# Patient Record
Sex: Female | Born: 1990 | Race: White | Hispanic: No | Marital: Single | Smoking: Former smoker
Health system: Southern US, Community
[De-identification: ages and names within clinical notes are randomized; demographics above are authoritative.]

## PROBLEM LIST (undated history)

## (undated) DIAGNOSIS — Z789 Other specified health status: Secondary | ICD-10-CM

## (undated) HISTORY — PX: NO PAST SURGERIES: SHX2092

## (undated) HISTORY — DX: Other specified health status: Z78.9

---

## 2016-07-23 ENCOUNTER — Encounter: Payer: Self-pay | Admitting: Obstetrics and Gynecology

## 2016-07-24 ENCOUNTER — Ambulatory Visit: Payer: Self-pay | Admitting: Obstetrics and Gynecology

## 2016-07-24 ENCOUNTER — Encounter: Payer: Self-pay | Admitting: Obstetrics and Gynecology

## 2016-07-24 VITALS — BP 102/72 | HR 128 | Ht 65.0 in | Wt 168.0 lb

## 2016-07-24 DIAGNOSIS — N914 Secondary oligomenorrhea: Secondary | ICD-10-CM

## 2016-07-24 DIAGNOSIS — Z30011 Encounter for initial prescription of contraceptive pills: Secondary | ICD-10-CM

## 2016-07-24 NOTE — Progress Notes (Signed)
   Chief Complaint  Patient presents with  . Menstrual Problem    HPI:      Ms. Suzanne Davies is a 26 y.o. No obstetric history on file. who LMP was Patient's last menstrual period was 07/10/2016 (exact date)., presents today for NP menstrual problem. Pt did not have a period 3/18 and 4/18 with neg home UPT. She did have a regular period again 5/18. Menses are usually monthly, lasting 4-5 days, med flow. No BTB. Pos dysmen, not relieved with ibup. She sometimes has to miss activities due to sx. She has never missed her period before and was concerned. She had increased stress with relationship issues 2/18.  She is currently sex active, using condoms. She would like BC for dysmen. [facility] No hx of migraines with aura/HTN/clotting disorders.     There are no active problems to display for this patient.  Past Medical History:  Diagnosis Date  . Known health problems: none    Past Surgical History:  Procedure Laterality Date  . NO PAST SURGERIES       Family History  Problem Relation Age of Onset  . AAA (abdominal aortic aneurysm) Maternal Grandmother   . AAA (abdominal aortic aneurysm) Maternal Grandfather     Social History   Social History  . Marital status: Unknown    Spouse name: N/A  . Number of children: N/A  . Years of education: N/A   Occupational History  . Not on file.   Social History Main Topics  . Smoking status: Current Every Day Smoker    Packs/day: 0.25  . Smokeless tobacco: Never Used  . Alcohol use Yes  . Drug use: No  . Sexual activity: Yes    Birth control/ protection: Condom   Other Topics Concern  . Not on file   Social History Narrative  . No narrative on file    No current outpatient prescriptions on file.  Review of Systems  Constitutional: Negative for fever.  Gastrointestinal: Negative for blood in stool, constipation, diarrhea, nausea and vomiting.  Genitourinary: Positive for menstrual problem. Negative for  dyspareunia, dysuria, flank pain, frequency, hematuria, urgency, vaginal bleeding, vaginal discharge and vaginal pain.  Musculoskeletal: Negative for back pain.  Skin: Negative for rash.  Neurological: Positive for numbness. Negative for light-headedness and headaches.     OBJECTIVE:   Vitals:  BP 102/72   Pulse (!) 128   Ht 5\' 5"  (1.651 m)   Wt 168 lb (76.2 kg)   LMP 07/10/2016 (Exact Date)   BMI 27.96 kg/m   Physical Exam  DEFERRED  Assessment/Plan: Encounter for initial prescription of contraceptive pills - OCP start with next menses. Condoms. RTO in 6 wks for f/u /annual.  Secondary oligomenorrhea - Resolved spontaneously. Don't know etiology. F/u prn.     Return in about 6 weeks (around 09/04/2016) for annual.  Yadriel Kerrigan B. Tiasha Helvie, PA-C 07/24/2016 5:13 PM

## 2016-09-16 ENCOUNTER — Ambulatory Visit: Payer: Managed Care, Other (non HMO) | Admitting: Obstetrics and Gynecology

## 2018-11-01 ENCOUNTER — Emergency Department
Admission: EM | Admit: 2018-11-01 | Discharge: 2018-11-01 | Disposition: A | Discharge status: Home / Self Care | Payer: Self-pay | Attending: Student in an Organized Health Care Education/Training Program | Admitting: Student in an Organized Health Care Education/Training Program

## 2018-11-01 ENCOUNTER — Other Ambulatory Visit: Payer: Self-pay

## 2018-11-01 ENCOUNTER — Emergency Department: Payer: Self-pay

## 2018-11-01 DIAGNOSIS — F1510 Other stimulant abuse, uncomplicated: Secondary | ICD-10-CM | POA: Insufficient documentation

## 2018-11-01 DIAGNOSIS — Z20828 Contact with and (suspected) exposure to other viral communicable diseases: Secondary | ICD-10-CM | POA: Insufficient documentation

## 2018-11-01 DIAGNOSIS — F17210 Nicotine dependence, cigarettes, uncomplicated: Secondary | ICD-10-CM | POA: Insufficient documentation

## 2018-11-01 DIAGNOSIS — F1210 Cannabis abuse, uncomplicated: Secondary | ICD-10-CM | POA: Insufficient documentation

## 2018-11-01 DIAGNOSIS — F1310 Sedative, hypnotic or anxiolytic abuse, uncomplicated: Secondary | ICD-10-CM | POA: Insufficient documentation

## 2018-11-01 DIAGNOSIS — R462 Strange and inexplicable behavior: Secondary | ICD-10-CM

## 2018-11-01 DIAGNOSIS — F1410 Cocaine abuse, uncomplicated: Secondary | ICD-10-CM | POA: Insufficient documentation

## 2018-11-01 DIAGNOSIS — F1910 Other psychoactive substance abuse, uncomplicated: Secondary | ICD-10-CM | POA: Diagnosis present

## 2018-11-01 LAB — URINALYSIS, COMPLETE (UACMP) WITH MICROSCOPIC
Bacteria, UA: NONE SEEN
Bilirubin Urine: NEGATIVE
Glucose, UA: NEGATIVE mg/dL
Hgb urine dipstick: NEGATIVE
Ketones, ur: NEGATIVE mg/dL
Leukocytes,Ua: NEGATIVE
Nitrite: NEGATIVE
Protein, ur: NEGATIVE mg/dL
Specific Gravity, Urine: 1.006 (ref 1.005–1.030)
WBC, UA: NONE SEEN WBC/hpf (ref 0–5)
pH: 6 (ref 5.0–8.0)

## 2018-11-01 LAB — URINE DRUG SCREEN, QUALITATIVE (ARMC ONLY)
Amphetamines, Ur Screen: POSITIVE — AB
Barbiturates, Ur Screen: NOT DETECTED
Benzodiazepine, Ur Scrn: POSITIVE — AB
Cannabinoid 50 Ng, Ur ~~LOC~~: POSITIVE — AB
Cocaine Metabolite,Ur ~~LOC~~: POSITIVE — AB
MDMA (Ecstasy)Ur Screen: NOT DETECTED
Methadone Scn, Ur: NOT DETECTED
Opiate, Ur Screen: NOT DETECTED
Phencyclidine (PCP) Ur S: NOT DETECTED
Tricyclic, Ur Screen: POSITIVE — AB

## 2018-11-01 LAB — CBC WITH DIFFERENTIAL/PLATELET
Abs Immature Granulocytes: 0.03 10*3/uL (ref 0.00–0.07)
Basophils Absolute: 0 10*3/uL (ref 0.0–0.1)
Basophils Relative: 1 %
Eosinophils Absolute: 0.1 10*3/uL (ref 0.0–0.5)
Eosinophils Relative: 1 %
HCT: 41.6 % (ref 36.0–46.0)
Hemoglobin: 13.9 g/dL (ref 12.0–15.0)
Immature Granulocytes: 0 %
Lymphocytes Relative: 29 %
Lymphs Abs: 2.1 10*3/uL (ref 0.7–4.0)
MCH: 32 pg (ref 26.0–34.0)
MCHC: 33.4 g/dL (ref 30.0–36.0)
MCV: 95.9 fL (ref 80.0–100.0)
Monocytes Absolute: 0.3 10*3/uL (ref 0.1–1.0)
Monocytes Relative: 5 %
Neutro Abs: 4.5 10*3/uL (ref 1.7–7.7)
Neutrophils Relative %: 64 %
Platelets: 198 10*3/uL (ref 150–400)
RBC: 4.34 MIL/uL (ref 3.87–5.11)
RDW: 13.1 % (ref 11.5–15.5)
WBC: 7 10*3/uL (ref 4.0–10.5)
nRBC: 0 % (ref 0.0–0.2)

## 2018-11-01 LAB — ACETAMINOPHEN LEVEL: Acetaminophen (Tylenol), Serum: <10 ug/mL — ABNORMAL LOW (ref 10–30)

## 2018-11-01 LAB — POCT PREGNANCY, URINE
Preg Test, Ur: NEGATIVE
Preg Test, Ur: NEGATIVE

## 2018-11-01 LAB — COMPREHENSIVE METABOLIC PANEL
ALT: 19 U/L (ref 0–44)
AST: 27 U/L (ref 15–41)
Albumin: 4.2 g/dL (ref 3.5–5.0)
Alkaline Phosphatase: 52 U/L (ref 38–126)
Anion gap: 10 (ref 5–15)
BUN: 9 mg/dL (ref 6–20)
CO2: 23 mmol/L (ref 22–32)
Calcium: 8.6 mg/dL — ABNORMAL LOW (ref 8.9–10.3)
Chloride: 108 mmol/L (ref 98–111)
Creatinine, Ser: 0.72 mg/dL (ref 0.44–1.00)
GFR calc Af Amer: >60 mL/min (ref >60)
GFR calc non Af Amer: >60 mL/min (ref >60)
Glucose, Bld: 90 mg/dL (ref 70–99)
Potassium: 4 mmol/L (ref 3.5–5.1)
Sodium: 141 mmol/L (ref 135–145)
Total Bilirubin: 0.3 mg/dL (ref 0.3–1.2)
Total Protein: 6.9 g/dL (ref 6.5–8.1)

## 2018-11-01 LAB — SARS CORONAVIRUS 2 BY RT PCR (HOSPITAL ORDER, PERFORMED IN ~~LOC~~ HOSPITAL LAB): SARS Coronavirus 2: NEGATIVE

## 2018-11-01 MED ORDER — NALOXONE HCL 2 MG/2ML IJ SOSY
0.4000 mg | PREFILLED_SYRINGE | INTRAMUSCULAR | Status: DC | PRN
  Administered 2018-11-01: 11:00:00 0.4 mg via INTRAVENOUS
  Filled 2018-11-01: qty 2

## 2018-11-01 MED ORDER — SODIUM CHLORIDE 0.9 % IV BOLUS
1000.0000 mL | Freq: Once | INTRAVENOUS | Status: AC
  Administered 2018-11-01: 10:00:00 1000 mL via INTRAVENOUS

## 2018-11-01 MED ORDER — LORAZEPAM 0.5 MG PO TABS
0.5000 mg | ORAL_TABLET | Freq: Once | ORAL | Status: DC
  Filled 2018-11-01: qty 1

## 2018-11-01 NOTE — ED Notes (Signed)
Fathers contact info Tarryn Bogdan [phone]

## 2018-11-01 NOTE — ED Provider Notes (Signed)
[facility] Emergency Department Provider Note    None    (approximate)  I have reviewed the triage vital signs and the nursing notes.   HISTORY  Chief Complaint Altered Mental Status (Unresponsive)    HPI Suzanne Davies is a 28 y.o. female no significant past medical history listed reported presents for being found unresponsive [facility] parking lot this morning.  Patient was not given any Narcan with her was suspected overdose.  Reportedly had multiple cuts straws found in the car.  Patient very anxious appearing somewhat evasive.  Denies any overdose.  States that she has been feeling overwhelmed and has not been sleeping or eating.  She denies any medication use.  Denies any chest pain or pressure.  No shortness of breath.  No headache.    Past Medical History:  Diagnosis Date  . Known health problems: none    Family History  Problem Relation Age of Onset  . AAA (abdominal aortic aneurysm) Maternal Grandmother   . AAA (abdominal aortic aneurysm) Maternal Grandfather    Past Surgical History:  Procedure Laterality Date  . NO PAST SURGERIES     There are no active problems to display for this patient.     Prior to Admission medications   Not on File    Allergies Penicillins and Sulfa antibiotics    Social History Social History   Tobacco Use  . Smoking status: Current Every Day Smoker    Packs/day: 0.25  . Smokeless tobacco: Never Used  Substance Use Topics  . Alcohol use: Yes    Alcohol/week: 1.0 standard drinks    Types: 1 Glasses of wine per week    Comment: 8  . Drug use: No    Review of Systems Patient denies headaches, rhinorrhea, blurry vision, numbness, shortness of breath, chest pain, edema, cough, abdominal pain, nausea, vomiting, diarrhea, dysuria, fevers, rashes or hallucinations unless otherwise stated above in HPI. ____________________________________________   PHYSICAL EXAM:  VITAL SIGNS: Vitals:   11/01/18  1130 11/01/18 1200  BP: 96/67 97/69  Pulse: 93 97  Resp: (!) 25 (!) 25  SpO2: 96% 97%    Constitutional: Alert, anxious appearing, tearful Eyes: Conjunctivae are normal.  Head: Atraumatic. Nose: No congestion/rhinnorhea. Mouth/Throat: Mucous membranes are moist.   Neck: No stridor. Painless ROM.  Cardiovascular: Normal rate, regular rhythm. Grossly normal heart sounds.  Good peripheral circulation. Respiratory: Normal respiratory effort.  No retractions. Lungs CTAB. Gastrointestinal: Soft and nontender. No distention. No abdominal bruits. No CVA tenderness. Genitourinary:  Musculoskeletal: No lower extremity tenderness nor edema.  No joint effusions. Neurologic:  Normal speech and language. No gross focal neurologic deficits are appreciated. No facial droop Skin:  Skin is warm, dry and intact. No rash noted. Psychiatric: Mood and affect are anxious,  Avoids answering questions about history, disorganized_______________________________   LABS (all labs ordered are listed, but only abnormal results are displayed)  Results for orders placed or performed during the hospital encounter of 11/01/18 (from the past 24 hour(s))  CBC with Differential/Platelet     Status: None   Collection Time: 11/01/18  9:39 AM  Result Value Ref Range   WBC 7.0 4.0 - 10.5 K/uL   RBC 4.34 3.87 - 5.11 MIL/uL   Hemoglobin 13.9 12.0 - 15.0 g/dL   HCT 41.6 36.0 - 46.0 %   MCV 95.9 80.0 - 100.0 fL   MCH 32.0 26.0 - 34.0 pg   MCHC 33.4 30.0 - 36.0 g/dL   RDW 13.1 11.5 -  15.5 %   Platelets 198 150 - 400 K/uL   nRBC 0.0 0.0 - 0.2 %   Neutrophils Relative % 64 %   Neutro Abs 4.5 1.7 - 7.7 K/uL   Lymphocytes Relative 29 %   Lymphs Abs 2.1 0.7 - 4.0 K/uL   Monocytes Relative 5 %   Monocytes Absolute 0.3 0.1 - 1.0 K/uL   Eosinophils Relative 1 %   Eosinophils Absolute 0.1 0.0 - 0.5 K/uL   Basophils Relative 1 %   Basophils Absolute 0.0 0.0 - 0.1 K/uL   Immature Granulocytes 0 %   Abs Immature Granulocytes  0.03 0.00 - 0.07 K/uL  Comprehensive metabolic panel     Status: Abnormal   Collection Time: 11/01/18  9:39 AM  Result Value Ref Range   Sodium 141 135 - 145 mmol/L   Potassium 4.0 3.5 - 5.1 mmol/L   Chloride 108 98 - 111 mmol/L   CO2 23 22 - 32 mmol/L   Glucose, Bld 90 70 - 99 mg/dL   BUN 9 6 - 20 mg/dL   Creatinine, Ser 1.82 0.44 - 1.00 mg/dL   Calcium 8.6 (L) 8.9 - 10.3 mg/dL   Total Protein 6.9 6.5 - 8.1 g/dL   Albumin 4.2 3.5 - 5.0 g/dL   AST 27 15 - 41 U/L   ALT 19 0 - 44 U/L   Alkaline Phosphatase 52 38 - 126 U/L   Total Bilirubin 0.3 0.3 - 1.2 mg/dL   GFR calc non Af Amer >60 >60 mL/min   GFR calc Af Amer >60 >60 mL/min   Anion gap 10 5 - 15  Pregnancy, urine POC     Status: None   Collection Time: 11/01/18 10:21 AM  Result Value Ref Range   Preg Test, Ur NEGATIVE NEGATIVE  Urinalysis, Complete w Microscopic     Status: Abnormal   Collection Time: 11/01/18 10:48 AM  Result Value Ref Range   Color, Urine STRAW (A) YELLOW   APPearance HAZY (A) CLEAR   Specific Gravity, Urine 1.006 1.005 - 1.030   pH 6.0 5.0 - 8.0   Glucose, UA NEGATIVE NEGATIVE mg/dL   Hgb urine dipstick NEGATIVE NEGATIVE   Bilirubin Urine NEGATIVE NEGATIVE   Ketones, ur NEGATIVE NEGATIVE mg/dL   Protein, ur NEGATIVE NEGATIVE mg/dL   Nitrite NEGATIVE NEGATIVE   Leukocytes,Ua NEGATIVE NEGATIVE   WBC, UA NONE SEEN 0 - 5 WBC/hpf   Bacteria, UA NONE SEEN NONE SEEN   Squamous Epithelial / LPF 0-5 0 - 5  Urine Drug Screen, Qualitative ([facility] only)     Status: Abnormal   Collection Time: 11/01/18 10:48 AM  Result Value Ref Range   Tricyclic, Ur Screen POSITIVE (A) NONE DETECTED   Amphetamines, Ur Screen POSITIVE (A) NONE DETECTED   MDMA (Ecstasy)Ur Screen NONE DETECTED NONE DETECTED   Cocaine Metabolite,Ur [place] POSITIVE (A) NONE DETECTED   Opiate, Ur Screen NONE DETECTED NONE DETECTED   Phencyclidine (PCP) Ur S NONE DETECTED NONE DETECTED   Cannabinoid 50 Ng, Ur  POSITIVE (A) NONE DETECTED    Barbiturates, Ur Screen NONE DETECTED NONE DETECTED   Benzodiazepine, Ur Scrn POSITIVE (A) NONE DETECTED   Methadone Scn, Ur NONE DETECTED NONE DETECTED  Pregnancy, urine POC     Status: None   Collection Time: 11/01/18 10:50 AM  Result Value Ref Range   Preg Test, Ur NEGATIVE NEGATIVE   ____________________________________________  EKG My review and personal interpretation at Time: 9:33   Indication: ams  Rate: 90  Rhythm: sinus Axis: normal Other: normal intervals, no stemi ____________________________________________  RADIOLOGY  I personally reviewed all radiographic images ordered to evaluate for the above acute complaints and reviewed radiology reports and findings.  These findings were personally discussed with the patient.  Please see medical record for radiology report.  ____________________________________________   PROCEDURES  Procedure(s) performed:  Procedures    Critical Care performed: no ____________________________________________   INITIAL IMPRESSION / ASSESSMENT AND PLAN / ED COURSE  Pertinent labs & imaging results that were available during my care of the patient were reviewed by me and considered in my medical decision making (see chart for details).   DDX: Psychosis, delirium, medication effect, noncompliance, polysubstance abuse, Si, Hi, depression   Suzanne Davies is a 28 y.o. who presents to the ED with symptoms as described above.  Patient is confused bizarre behavior.  Suspect some substance abuse.  Currently cooperative protecting her airway.  Blood will be sent for the by differential.  She denies any substance abuse or overdose.  Clinical Course as of Nov 01 1230  Wynelle LinkSun Nov 01, 2018  1223 Patient with multiple substance in her system likely explaining her abnormal behavior.  Will place under IVC due to concern for intentional overdose.   [PR]  1229 Patient's father at bedside states that she has been going through relationship struggles has not  been sleeping for several days.  He endorses history of marijuana use.  States that she has been alluding to wanting to harm herself.  He thought previously was due to get attention.   [PR]    Clinical Course User Index [PR] Willy Eddyobinson, Bronte Kropf, MD    The patient was evaluated in Emergency Department today for the symptoms described in the history of present illness. He/she was evaluated in the context of the global COVID-19 pandemic, which necessitated consideration that the patient might be at risk for infection with the SARS-CoV-2 virus that causes COVID-19. Institutional protocols and algorithms that pertain to the evaluation of patients at risk for COVID-19 are in a state of rapid change based on information released by regulatory bodies including the CDC and federal and state organizations. These policies and algorithms were followed during the patient's care in the ED.  As part of my medical decision making, I reviewed the following data within the electronic MEDICAL RECORD NUMBER Nursing notes reviewed and incorporated, Labs reviewed, notes from prior ED visits and [place] Controlled Substance Database   ____________________________________________   FINAL CLINICAL IMPRESSION(S) / ED DIAGNOSES  Final diagnoses:  Polysubstance abuse (HCC)  Bizarre behavior      NEW MEDICATIONS STARTED DURING THIS VISIT:  New Prescriptions   No medications on file     Note:  This document was prepared using Dragon voice recognition software and may include unintentional dictation errors.    Willy Eddyobinson, Kenyetta Fife, MD 11/01/18 [phone]

## 2018-11-01 NOTE — ED Triage Notes (Signed)
Cuts on bottom of feet, pupils unequal.

## 2018-11-01 NOTE — BH Assessment (Signed)
Assessment Note  Suzanne Davies is an 28 y.o. female who presents to the ER after she was found unresponsive in the parking lot of a local pharmacy store. Per the report of the patient, she does not know why she was brought to the ER. She denies having a suicide attempt, as well as overdose. Patient states, "I was sleep."  Per the report of the patient's father Susy Frizzle Bazzle-[phone]), the patient doesn't have a history of self-injurious behaviors nor suicidal ideations and gestures. He also reports she works long hours and "was probably tired." She has history of "staying up for long hours to do work and crashed." Patient's father reports of having no knowledge of the patient using substances. Writer didn't disclose anything about her substance use.  Per the report of the patient's boyfriend Tasia Catchings H.), the patient was at his home and they were drinking. He stopped drinking before she stopped, so he can drop her off. He dropped her off at her car at the local grocery store and that was the last time he saw her. He further reports, the patient has no history of self-harm or suicidal gestures or ideations. He admits to the use of other substances but wouldn't give details.  During the interview, the patient was lethargic but was able to participate with the assessment. Several times throughout the interview, she denies SI/HI and AV/H. She admits to the use of mind-altering substances.   Diagnosis: Substance Use Disorder  Past Medical History:  Past Medical History:  Diagnosis Date  . Known health problems: none     Past Surgical History:  Procedure Laterality Date  . NO PAST SURGERIES      Family History:  Family History  Problem Relation Age of Onset  . AAA (abdominal aortic aneurysm) Maternal Grandmother   . AAA (abdominal aortic aneurysm) Maternal Grandfather     Social History:  reports that she has been smoking. She has been smoking about 0.25 packs per day. She has never used smokeless  tobacco. She reports current alcohol use of about 1.0 standard drinks of alcohol per week. She reports that she does not use drugs.  Additional Social History:  Alcohol / Drug Use Pain Medications: See PTA Prescriptions: See PTA Over the Counter: See PTA History of alcohol / drug use?: Yes Longest period of sobriety (when/how long): Unable to quantify Substance #1 Name of Substance 1: Cananbis 1 - Last Use / Amount: 10/31/2018 Substance #2 Name of Substance 2: Alcohol 2 - Last Use / Amount: 10/31/2018  CIWA: CIWA-Ar BP: 110/84 Pulse Rate: 79 COWS:    Allergies:  Allergies  Allergen Reactions  . Penicillins Anaphylaxis  . Sulfa Antibiotics Anaphylaxis    Home Medications: (Not in a hospital admission)   OB/GYN Status:  Patient's last menstrual period was 10/20/2018 (approximate).  General Assessment Data Location of Assessment: [facility] ED TTS Assessment: In system Is this a Tele or Face-to-Face Assessment?: Face-to-Face Is this an Initial Assessment or a Re-assessment for this encounter?: Initial Assessment Language Other than English: No Living Arrangements: Other (Comment)(Private Home) What gender do you identify as?: Female Marital status: Long term relationship Maiden name: n/a Pregnancy Status: No Living Arrangements: Alone Can pt return to current living arrangement?: Yes Admission Status: Involuntary Petitioner: ED Attending Is patient capable of signing voluntary admission?: No(Under IVC) Referral Source: Self/Family/Friend Insurance type: None  Medical Screening Exam [facility] Walk-in ONLY) Medical Exam completed: Yes  Crisis Care Plan Living Arrangements: Alone Legal Guardian: Other:(Self) Name of Psychiatrist: Reports  of none Name of Therapist: Reports of none  Education Status Is patient currently in school?: No Is the patient employed, unemployed or receiving disability?: Employed  Risk to self with the past 6 months Suicidal Ideation: No Has  patient been a risk to self within the past 6 months prior to admission? : No Suicidal Intent: No Has patient had any suicidal intent within the past 6 months prior to admission? : No Is patient at risk for suicide?: No Suicidal Plan?: No Has patient had any suicidal plan within the past 6 months prior to admission? : No Access to Means: No What has been your use of drugs/alcohol within the last 12 months?: Alcohol & Cannabis Previous Attempts/Gestures: No Other Self Harm Risks: Reports of none Triggers for Past Attempts: None known Intentional Self Injurious Behavior: None Family Suicide History: No Recent stressful life event(s): Other (Comment)(Increased hours of work) Persecutory voices/beliefs?: No Depression: No Depression Symptoms: (Reports of none) Substance abuse history and/or treatment for substance abuse?: No Suicide prevention information given to non-admitted patients: Not applicable  Risk to Others within the past 6 months Homicidal Ideation: No Does patient have any lifetime risk of violence toward others beyond the six months prior to admission? : No Thoughts of Harm to Others: No Current Homicidal Intent: No Current Homicidal Plan: No Access to Homicidal Means: No Identified Victim: Reports of none History of harm to others?: No Assessment of Violence: None Noted Violent Behavior Description: Reports of none Does patient have access to weapons?: No Criminal Charges Pending?: No Does patient have a court date: No Is patient on probation?: No  Psychosis Hallucinations: None noted Delusions: None noted  Mental Status Report Appearance/Hygiene: Unremarkable, In scrubs Eye Contact: Fair Motor Activity: Unable to assess(Patient was laying in the bed) Speech: Soft, Slurred Level of Consciousness: Drowsy Mood: Pleasant Affect: Appropriate to circumstance Anxiety Level: None Thought Processes: Coherent, Relevant Judgement: Partial Orientation: Person,  Place, Time, Situation, Appropriate for developmental age Obsessive Compulsive Thoughts/Behaviors: None  Cognitive Functioning Concentration: Normal Memory: Recent Intact, Remote Intact Is patient IDD: No Insight: Fair Impulse Control: Fair Appetite: Good Have you had any weight changes? : No Change Sleep: Decreased(Per the report of the patient's father) Total Hours of Sleep: (Unknown) Vegetative Symptoms: None  ADLScreening [facility] Assessment Services) Patient's cognitive ability adequate to safely complete daily activities?: Yes Patient able to express need for assistance with ADLs?: Yes Independently performs ADLs?: Yes (appropriate for developmental age)  Prior Inpatient Therapy Prior Inpatient Therapy: No  Prior Outpatient Therapy Prior Outpatient Therapy: No Does patient have an ACCT team?: No Does patient have Intensive In-House Services?  : No Does patient have [facility] services? : No Does patient have P4CC services?: No  ADL Screening (condition at time of admission) Patient's cognitive ability adequate to safely complete daily activities?: Yes Is the patient deaf or have difficulty hearing?: No Does the patient have difficulty seeing, even when wearing glasses/contacts?: No Does the patient have difficulty concentrating, remembering, or making decisions?: No Patient able to express need for assistance with ADLs?: Yes Does the patient have difficulty dressing or bathing?: No Independently performs ADLs?: Yes (appropriate for developmental age) Does the patient have difficulty walking or climbing stairs?: No Weakness of Legs: None Weakness of Arms/Hands: None  Home Assistive Devices/Equipment Home Assistive Devices/Equipment: None  Therapy Consults (therapy consults require a physician order) PT Evaluation Needed: No OT Evalulation Needed: No SLP Evaluation Needed: No Abuse/Neglect Assessment (Assessment to be complete while patient is alone) Abuse/Neglect  Assessment Can Be Completed: Yes Physical Abuse: Denies Verbal Abuse: Denies Sexual Abuse: Denies Exploitation of patient/patient's resources: Denies Self-Neglect: Denies Values / Beliefs Cultural Requests During Hospitalization: None Spiritual Requests During Hospitalization: None Consults Spiritual Care Consult Needed: No Social Work Consult Needed: No Merchant navy officerAdvance Directives (For Healthcare) Does Patient Have a Medical Advance Directive?: No Would patient like information on creating a medical advance directive?: No - Patient declined       Child/Adolescent Assessment Running Away Risk: Denies(Patient is adult)  Disposition:  Disposition Initial Assessment Completed for this Encounter: Yes  On Site Evaluation by:   Reviewed with Physician:    Lilyan Gilfordalvin J. Addysin Porco MS, LCAS, [facility], NCC Therapeutic Triage Specialist 11/01/2018 7:53 PM

## 2018-11-01 NOTE — ED Notes (Signed)
Pt AOx4. Ambulatory. Steady. Pt belongings returned. Pt signed paper copy of d/c. Verbalizes understanding of d/c paperwork.

## 2018-11-01 NOTE — Discharge Instructions (Signed)
°[facility] (Mental Health & Substance Use Services) & [facility] ° °Mental health service in [place], [place] °Address: [address], [place], [place] [address] °Hours:  °Closed ? Opens 8AM Mon °Phone: [phone] °Substance Use Disorder °Substance use disorder occurs when a person's repeated use of drugs or alcohol interferes with his or her ability to be productive. This disorder can cause problems with mental and physical health. It can affect your ability to have healthy relationships, and it can keep you from being able to meet your responsibilities at work, home, or school. It can also lead to addiction, which is a condition in which the person cannot stop using the substance consistently for a period of time. °Addiction changes the way the brain works. Because of these changes, addiction is a chronic condition. Substance use disorder can be mild, moderate, or severe. °The most commonly abused substances include: °· Alcohol. °· Tobacco. °· Marijuana. °· Stimulants, such as cocaine and methamphetamine. °· Hallucinogens, such as LSD and PCP. °· Opioids, such as some prescription pain medicines and heroin. °What are the causes? °This condition may develop due to many complex social, psychological, or physical reasons, such as: °· Stress. °· Abuse. °· Peer pressure. °· Anxiety or depression. °What increases the risk? °This condition is more likely to develop in people who: °· Use substances to cope with stress. °· Have been abused. °· Have a mental health disorder, such as depression. °· Have a family history of substance use disorder. °What are the signs or symptoms? °Symptoms of this condition include: °· Using the substance for longer periods of time or at a higher dosage than what is normal or intended. °· Having a lasting desire to use the substance. °· Being unable to slow down or stop the use of the  substance. °· Spending an abnormal amount of time getting the substance, using the substance, or recovering from using the substance. °· Using the substance in a way that interferes with work, school, social activities, and personal relationships. °· Using the substance even after having negative consequences, such as: °? Health problems. °? Legal or financial troubles. °? Job loss. °? Relationship problems. °· Needing more and more of the substance to get the same effect (developing tolerance). °· Experiencing unpleasant symptoms if you do not use the substance (withdrawal). °· Using the substance to avoid withdrawal symptoms. °How is this diagnosed? °This condition may be diagnosed based on: °· A physical exam. °· Your history of substance use. °· Your symptoms. This includes: °? How substance use affects your life. °? Changes in personality, behaviors, and mood. °? Having at least two symptoms of substance use disorder within a 12-month period. °? Health issues related to substance use, such as liver damage, shortness of breath, fatigue, cough, or heart problems. °· Blood or urine tests to screen for alcohol and drugs. °How is this treated? °This condition may be treated by: °· Stopping substance use safely. This may require taking medicines and being closely monitored for several days. °· Taking part in group and individual counseling from mental health providers who help people with substance use disorder. °· Staying at a live-in (residential) treatment center for several days or weeks. °· Attending daily counseling sessions at a treatment center. °· Taking medicine as told by your health care provider: °? To ease symptoms and prevent complications during withdrawal. °? To treat other mental health issues, such as depression or anxiety. °?   To block cravings by causing the same effects as the substance. °? To block the effects of the substance or replace good sensations with unpleasant ones. °· Participating in  a support group to share your experience with others who are going through the same thing. These groups are an important part of long-term recovery for many people. °Recovery can be a long process. Many people who undergo treatment start using the substance again after stopping (relapse). If you relapse, that does not mean that treatment will not work. °Follow these instructions at home: ° °· Take over-the-counter and prescription medicines only as told by your health care provider. °· Do not use any drugs or alcohol. °· Avoid temptations or triggers that you associate with your use of the substance. °· Learn and practice techniques for managing stress. °· Have a plan for vulnerable moments. Get phone numbers of people who are willing to help and who are committed to your recovery. °· Attend support groups on a regular basis. These groups include 12-step programs like Alcoholics Anonymous and Narcotics Anonymous. °· Keep all follow-up visits as told by your health care providers. This is important. This includes continuing to work with therapists and support groups. °Contact a health care provider if: °· You cannot take your medicines as told. °· Your symptoms get worse. °· You have trouble resisting the urge to use drugs or alcohol. °Get help right away if you: °· Relapse. °· Think that you may have taken too much of a drug. The hotline of the National Poison Control Center is (800) 222-1222. °· Have signs of an overdose. Symptoms include: °? Chest pain. °? Confusion. °? Sleepiness or difficulty staying awake. °? Slowed breathing. °? Nausea or vomiting. °? A seizure. °· Have serious thoughts about hurting yourself or someone else. °Drug overdose is an emergency. Do not wait to see if the symptoms will go away. Get medical help right away. Call your local emergency services (911 in the [place] Do not drive yourself to the hospital. °If you ever feel like you may hurt yourself or others, or have thoughts about taking  your own life, get help right away. You can go to your nearest emergency department or call: °· Your local emergency services (911 in the [place] °· A suicide crisis helpline, such as the National Suicide Prevention Lifeline at 1-800-273-8255. This is open 24 hours a day. °Summary °· Substance use disorder occurs when a person's repeated use of drugs or alcohol interferes with his or her ability to be productive. °· Taking part in group and individual counseling from mental health providers is a common treatment for people with substance use disorder. °· Recovery can be a long process. Many people who undergo treatment start using the substance again after stopping (relapse). A relapse does not mean that treatment will not work. °· Attend support groups such as Alcoholics Anonymous and Narcotics Anonymous. These groups are an important part of long-term recovery for many people. °This information is not intended to replace advice given to you by your health care provider. Make sure you discuss any questions you have with your health care provider. °Document Released: 09/26/2004 Document Revised: 05/28/2018 Document Reviewed: 03/18/2017 °Elsevier Patient Education © 2020 [employer] Inc. ° °

## 2018-11-01 NOTE — ED Notes (Signed)
Pt given meal tray.

## 2018-11-01 NOTE — ED Triage Notes (Signed)
29 yo female, [facility], unresponsive in car, knowck on window, on response, break window, wakes up, starts to drive off, pulled out of car, confused, not sure where she's at initially, doesn't know why she was at [facility] All VS good  102/58 BS 109 HR 105 98% room air bottle of pills in car found don't belong to her  Colchicine 0.6 mg tablets Straws cut, parephernalia now alert, knows where she was denies drug use, alcohol use, medical problems

## 2018-11-01 NOTE — ED Notes (Signed)
Pt awake at this time and asking this RN about what is going on. This RN explained that she has been IVCd and it waiting on psych to come see her. Pt agreeable at this time.

## 2018-11-01 NOTE — ED Notes (Signed)
Pt denies taking the medications/drugs to hurt herself. Roselyn Reef, NP at bedside.

## 2018-11-01 NOTE — Consult Note (Addendum)
[facility]   Reason for Consult:  overdoae  Referring Physician:  EDP Patient Identification: Suzanne Davies MRN:  [id number] Principal Diagnosis: Polysubstance abuse Diagnosis:  Active Problems:   Polysubstance abuse (HCC)   Total Time spent with patient: 1 hour  Subjective:   Suzanne Davies is a 28 y.o. female patient came to the ED after being drowsy and initially unresponsive in her car.  Reports she was using drugs to stay awake.  UDS positive for amphetamines, cocaine, benzodiazepines, and cannabis.  Patient seen and evaluated in person by this provider.  She was lying on the bed with some anxiety as she wanted to leave and did not understand why she was here.  Denies suicidal/homicidal ideations, hallucinations, and withdrawal symptoms.  Minimizes her drug abuse and declined rehab.  Appeared agreeable to "talk" to someone about her stress related to work and working 50 hours a week.  Lives in her own condo and has been in her relationship for two years.  Denies past psychiatric issues and reports she was using drugs to stay awake for work.  Explained the dangers of using to her and she feels coming to the ED was worry enough.    Collateral information obtained from her father, Darci CurrentMatt Pies, and her boyfriend, Roetta SessionsCraig Howell, with [employer].  Neither one had safety concerns for her.  The father stated she had not been getting much sleep the past three nights because of work.  He indicated to the EDP that she and her boyfriend were having issues but not to this team.  Denies past history and "know she overacts, like her mother" but not concerned about this.  The boyfriend reports they were drinking last night prior to dropping her off to her car this morning and she was "super tired."  The boyfriend did visit her in the ED and the father was in the parking lot also.  Both are agreeable to come and get her.  Father is willing to take off work tomorrow and monitor her if needed.  HPI per  MD:   28 y.o. female no significant past medical history listed reported presents for being found unresponsive [facility] parking lot this morning.  Patient was not given any Narcan with her was suspected overdose.  Reportedly had multiple cuts straws found in the car.  Patient very anxious appearing somewhat evasive.  Denies any overdose.  States that she has been feeling overwhelmed and has not been sleeping or eating.  She denies any medication use.  Denies any chest pain or pressure.  No shortness of breath.  No headache.  Past Psychiatric History: anxiety, depressoin  Risk to Self:   Risk to Others:  none Prior Inpatient Therapy:  none Prior Outpatient Therapy:  Therapy in the past  Past Medical History:  Past Medical History:  Diagnosis Date  . Known health problems: none     Past Surgical History:  Procedure Laterality Date  . NO PAST SURGERIES     Family History:  Family History  Problem Relation Age of Onset  . AAA (abdominal aortic aneurysm) Maternal Grandmother   . AAA (abdominal aortic aneurysm) Maternal Grandfather    Family Psychiatric  History: none Social History:  Social History   Substance and Sexual Activity  Alcohol Use Yes  . Alcohol/week: 1.0 standard drinks  . Types: 1 Glasses of wine per week   Comment: 8     Social History   Substance and Sexual Activity  Drug Use No  Social History   Socioeconomic History  . Marital status: Single    Spouse name: Not on file  . Number of children: Not on file  . Years of education: Not on file  . Highest education level: Not on file  Occupational History  . Not on file  Social Needs  . Financial resource strain: Not on file  . Food insecurity    Worry: Not on file    Inability: Not on file  . Transportation needs    Medical: Not on file    Non-medical: Not on file  Tobacco Use  . Smoking status: Current Every Day Smoker    Packs/day: 0.25  . Smokeless tobacco: Never Used  Substance and Sexual  Activity  . Alcohol use: Yes    Alcohol/week: 1.0 standard drinks    Types: 1 Glasses of wine per week    Comment: 8  . Drug use: No  . Sexual activity: Yes    Birth control/protection: None  Lifestyle  . Physical activity    Days per week: Not on file    Minutes per session: Not on file  . Stress: Not on file  Relationships  . Social Musician on phone: Not on file    Gets together: Not on file    Attends religious service: Not on file    Active member of club or organization: Not on file    Attends meetings of clubs or organizations: Not on file    Relationship status: Not on file  Other Topics Concern  . Not on file  Social History Narrative  . Not on file   Additional Social History:    Allergies:   Allergies  Allergen Reactions  . Penicillins Anaphylaxis  . Sulfa Antibiotics Anaphylaxis    Labs:  Results for orders placed or performed during the hospital encounter of 11/01/18 (from the past 48 hour(s))  CBC with Differential/Platelet     Status: None   Collection Time: 11/01/18  9:39 AM  Result Value Ref Range   WBC 7.0 4.0 - 10.5 K/uL   RBC 4.34 3.87 - 5.11 MIL/uL   Hemoglobin 13.9 12.0 - 15.0 g/dL   HCT 16.1 09.6 - 04.5 %   MCV 95.9 80.0 - 100.0 fL   MCH 32.0 26.0 - 34.0 pg   MCHC 33.4 30.0 - 36.0 g/dL   RDW 40.9 81.1 - 91.4 %   Platelets 198 150 - 400 K/uL   nRBC 0.0 0.0 - 0.2 %   Neutrophils Relative % 64 %   Neutro Abs 4.5 1.7 - 7.7 K/uL   Lymphocytes Relative 29 %   Lymphs Abs 2.1 0.7 - 4.0 K/uL   Monocytes Relative 5 %   Monocytes Absolute 0.3 0.1 - 1.0 K/uL   Eosinophils Relative 1 %   Eosinophils Absolute 0.1 0.0 - 0.5 K/uL   Basophils Relative 1 %   Basophils Absolute 0.0 0.0 - 0.1 K/uL   Immature Granulocytes 0 %   Abs Immature Granulocytes 0.03 0.00 - 0.07 K/uL    Comment: Performed at [facility], [address] [place], [place] [address]  Comprehensive metabolic panel     Status: Abnormal   Collection Time:  11/01/18  9:39 AM  Result Value Ref Range   Sodium 141 135 - 145 mmol/L   Potassium 4.0 3.5 - 5.1 mmol/L   Chloride 108 98 - 111 mmol/L   CO2 23 22 - 32 mmol/L   Glucose, Bld 90 70 -  99 mg/dL   BUN 9 6 - 20 mg/dL   Creatinine, Ser 0.72 0.44 - 1.00 mg/dL   Calcium 8.6 (L) 8.9 - 10.3 mg/dL   Total Protein 6.9 6.5 - 8.1 g/dL   Albumin 4.2 3.5 - 5.0 g/dL   AST 27 15 - 41 U/L   ALT 19 0 - 44 U/L   Alkaline Phosphatase 52 38 - 126 U/L   Total Bilirubin 0.3 0.3 - 1.2 mg/dL   GFR calc non Af Amer >60 >60 mL/min   GFR calc Af Amer >60 >60 mL/min   Anion gap 10 5 - 15    Comment: Performed at [facility], [address] [place], [place] [address]  Acetaminophen level     Status: Abnormal   Collection Time: 11/01/18  9:39 AM  Result Value Ref Range   Acetaminophen (Tylenol), Serum <10 (L) 10 - 30 ug/mL    Comment: (NOTE) Therapeutic concentrations vary significantly. A range of 10-30 ug/mL  may be an effective concentration for many patients. However, some  are best treated at concentrations outside of this range. Acetaminophen concentrations >150 ug/mL at 4 hours after ingestion  and >50 ug/mL at 12 hours after ingestion are often associated with  toxic reactions. Performed at [facility], [address] [place], [place] [address]   Pregnancy, urine POC     Status: None   Collection Time: 11/01/18 10:21 AM  Result Value Ref Range   Preg Test, Ur NEGATIVE NEGATIVE    Comment:        THE SENSITIVITY OF THIS METHODOLOGY IS >24 mIU/mL   Urinalysis, Complete w Microscopic     Status: Abnormal   Collection Time: 11/01/18 10:48 AM  Result Value Ref Range   Color, Urine STRAW (A) YELLOW   APPearance HAZY (A) CLEAR   Specific Gravity, Urine 1.006 1.005 - 1.030   pH 6.0 5.0 - 8.0   Glucose, UA NEGATIVE NEGATIVE mg/dL   Hgb urine dipstick NEGATIVE NEGATIVE   Bilirubin Urine NEGATIVE NEGATIVE   Ketones, ur NEGATIVE NEGATIVE mg/dL   Protein, ur NEGATIVE NEGATIVE  mg/dL   Nitrite NEGATIVE NEGATIVE   Leukocytes,Ua NEGATIVE NEGATIVE   WBC, UA NONE SEEN 0 - 5 WBC/hpf   Bacteria, UA NONE SEEN NONE SEEN   Squamous Epithelial / LPF 0-5 0 - 5    Comment: Performed at [facility], [address], [place], [place] [address]  Urine Drug Screen, Qualitative ([facility] only)     Status: Abnormal   Collection Time: 11/01/18 10:48 AM  Result Value Ref Range   Tricyclic, Ur Screen POSITIVE (A) NONE DETECTED   Amphetamines, Ur Screen POSITIVE (A) NONE DETECTED   MDMA (Ecstasy)Ur Screen NONE DETECTED NONE DETECTED   Cocaine Metabolite,Ur [place] POSITIVE (A) NONE DETECTED   Opiate, Ur Screen NONE DETECTED NONE DETECTED   Phencyclidine (PCP) Ur S NONE DETECTED NONE DETECTED   Cannabinoid 50 Ng, Ur [place] POSITIVE (A) NONE DETECTED   Barbiturates, Ur Screen NONE DETECTED NONE DETECTED   Benzodiazepine, Ur Scrn POSITIVE (A) NONE DETECTED   Methadone Scn, Ur NONE DETECTED NONE DETECTED    Comment: (NOTE) Tricyclics + metabolites, urine    Cutoff 1000 ng/mL Amphetamines + metabolites, urine  Cutoff 1000 ng/mL MDMA (Ecstasy), urine              Cutoff 500 ng/mL Cocaine Metabolite, urine          Cutoff 300 ng/mL Opiate + metabolites, urine  Cutoff 300 ng/mL Phencyclidine (PCP), urine         Cutoff 25 ng/mL Cannabinoid, urine                 Cutoff 50 ng/mL Barbiturates + metabolites, urine  Cutoff 200 ng/mL Benzodiazepine, urine              Cutoff 200 ng/mL Methadone, urine                   Cutoff 300 ng/mL The urine drug screen provides only a preliminary, unconfirmed analytical test result and should not be used for non-medical purposes. Clinical consideration and professional judgment should be applied to any positive drug screen result due to possible interfering substances. A more specific alternate chemical method must be used in order to obtain a confirmed analytical result. Gas chromatography / mass spectrometry (GC/MS) is the  preferred confirmat ory method. Performed at [facility], [address] [place], [place] [address]   Pregnancy, urine POC     Status: None   Collection Time: 11/01/18 10:50 AM  Result Value Ref Range   Preg Test, Ur NEGATIVE NEGATIVE    Comment:        THE SENSITIVITY OF THIS METHODOLOGY IS >24 mIU/mL   SARS Coronavirus 2 [facility](Hospital order, Performed in [facility] lab) Nasopharyngeal Nasopharyngeal Swab     Status: None   Collection Time: 11/01/18  3:30 PM   Specimen: Nasopharyngeal Swab  Result Value Ref Range   SARS Coronavirus 2 NEGATIVE NEGATIVE    Comment: (NOTE) If result is NEGATIVE SARS-CoV-2 target nucleic acids are NOT DETECTED. The SARS-CoV-2 RNA is generally detectable in upper and lower  respiratory specimens during the acute phase of infection. The lowest  concentration of SARS-CoV-2 viral copies this assay can detect is 250  copies / mL. A negative result does not preclude SARS-CoV-2 infection  and should not be used as the sole basis for treatment or other  patient management decisions.  A negative result may occur with  improper specimen collection / handling, submission of specimen other  than nasopharyngeal swab, presence of viral mutation(s) within the  areas targeted by this assay, and inadequate number of viral copies  (<250 copies / mL). A negative result must be combined with clinical  observations, patient history, and epidemiological information. If result is POSITIVE SARS-CoV-2 target nucleic acids are DETECTED. The SARS-CoV-2 RNA is generally detectable in upper and lower  respiratory specimens dur ing the acute phase of infection.  Positive  results are indicative of active infection with SARS-CoV-2.  Clinical  correlation with patient history and other diagnostic information is  necessary to determine patient infection status.  Positive results do  not rule out bacterial infection or co-infection with other viruses. If  result is PRESUMPTIVE POSTIVE SARS-CoV-2 nucleic acids MAY BE PRESENT.   A presumptive positive result was obtained on the submitted specimen  and confirmed on repeat testing.  While 2019 novel coronavirus  (SARS-CoV-2) nucleic acids may be present in the submitted sample  additional confirmatory testing may be necessary for epidemiological  and / or clinical management purposes  to differentiate between  SARS-CoV-2 and other Sarbecovirus currently known to infect humans.  If clinically indicated additional testing with an alternate test  methodology [phone]([id number]) is advised. The SARS-CoV-2 RNA is generally  detectable in upper and lower respiratory sp ecimens during the acute  phase of infection. The expected result is Negative. Fact Sheet for Patients:  BoilerBrush.com.cyhttps://www.fda.gov/media/136312/download  Fact Sheet for Healthcare Providers: https://pope.com/ This test is not yet approved or cleared by the [place] FDA and has been authorized for detection and/or diagnosis of SARS-CoV-2 by FDA under an Emergency Use Authorization (EUA).  This EUA will remain in effect (meaning this test can be used) for the duration of the COVID-19 declaration under Section 564(b)(1) of the Act, 21 U.S.C. section 360bbb-3(b)(1), unless the authorization is terminated or revoked sooner. Performed at [facility], [address], [place], [place] [address]     Current Facility-Administered Medications  Medication Dose Route Frequency Provider Last Rate Last Dose  . LORazepam (ATIVAN) tablet 0.5 mg  0.5 mg Oral Once Willy Eddy, MD      . naloxone [facility]) injection 0.4 mg  0.4 mg Intravenous PRN Willy Eddy, MD   0.4 mg at 11/01/18 1039   No current outpatient medications on file.    Musculoskeletal: Strength & Muscle Tone: within normal limits Gait & Station: normal Patient leans: N/A  Psychiatric Specialty Exam: Physical Exam  Nursing note and vitals  reviewed. Constitutional: She is oriented to person, place, and time. She appears well-developed and well-nourished.  HENT:  Head: Normocephalic.  Neck: Normal range of motion.  Respiratory: Effort normal.  Musculoskeletal: Normal range of motion.  Neurological: She is alert and oriented to person, place, and time.  Psychiatric: Her speech is normal and behavior is normal. Judgment and thought content normal. Her mood appears anxious. Cognition and memory are impaired.    Review of Systems  Psychiatric/Behavioral: Positive for substance abuse. The patient is nervous/anxious.   All other systems reviewed and are negative.   Blood pressure 116/89, pulse 77, resp. rate (!) 29, height 5\' 4"  (1.626 m), weight 68 kg, last menstrual period 10/20/2018, SpO2 98 %.Body mass index is 25.75 kg/m.  General Appearance: Disheveled  Eye Contact:  Good  Speech:  Normal Rate  Volume:  Normal  Mood:  Anxious, irritable  Affect:  Congruent  Thought Process:  Coherent and Descriptions of Associations: Intact  Orientation:  Full (Time, Place, and Person)  Thought Content:  WDL and Logical  Suicidal Thoughts:  No  Homicidal Thoughts:  No  Memory:  Immediate;   Fair Recent;   Fair Remote;   Fair  Judgement:  Fair  Insight:  Fair  Psychomotor Activity:  Normal  Concentration:  Concentration: Fair and Attention Span: Fair  Recall:  Good  Fund of Knowledge:  Good  Language:  Good  Akathisia:  No  Handed:  Right  AIMS (if indicated):     Assets:  Architect Housing Intimacy Leisure Time Physical Health Resilience Social Support Talents/Skills Transportation Vocational/Educational  ADL's:  Intact  Cognition:  WNL  Sleep:       Treatment Plan Summary: Daily contact with patient to assess and evaluate symptoms and progress in treatment, Medication management and Plan polysubstance abuse:  -Resources for [employer] -Recommended rehab, patient  declined  Disposition: No evidence of imminent risk to self or others at present.   Patient does not meet criteria for psychiatric inpatient admission. Discussed crisis plan, support from social network, calling 911, coming to the Emergency Department, and calling Suicide Hotline.  Nanine Means, NP 11/01/2018 5:17 PM   Case discussed with Dr. Shaune Pollack.  Agree with plan as outlined above.

## 2018-11-01 NOTE — ED Notes (Signed)
Pt has boyfriend visiting at bedside. Supervised by this RN. Pt wanded by offiicer.

## 2018-11-01 NOTE — ED Notes (Signed)
Pt dressed out with this RN and Elmyra Ricks, NT. Pt has pink wallet, black and white shirt, plaid pants, black flip flips, and pink bra. Pt has a ring on right ring finger that this RN is unable to take off at this time. Pt is sleeping. Will attempt to remove again when pt is more awake.

## 2018-11-01 NOTE — ED Notes (Signed)
Pt groggy, pt gave verbal permission for father to come visit after sternal rubbed to get a response

## 2018-11-01 NOTE — ED Notes (Signed)
Psychiatry at bedside.

## 2018-12-31 ENCOUNTER — Ambulatory Visit (LOCAL_COMMUNITY_HEALTH_CENTER): Payer: Self-pay

## 2018-12-31 ENCOUNTER — Other Ambulatory Visit: Payer: Self-pay

## 2018-12-31 VITALS — BP 108/72 | Ht 64.0 in | Wt 175.5 lb

## 2018-12-31 DIAGNOSIS — Z3201 Encounter for pregnancy test, result positive: Secondary | ICD-10-CM

## 2018-12-31 LAB — PREGNANCY, URINE: Preg Test, Ur: POSITIVE — AB

## 2018-12-31 NOTE — Progress Notes (Signed)
Pt already has supply of PNV. No [facility] available. Pt unsure of where she wants to go for prenatal care; declines preadmit.

## 2019-01-12 ENCOUNTER — Emergency Department: Payer: Self-pay

## 2019-01-12 ENCOUNTER — Encounter: Payer: Self-pay | Admitting: Emergency Medicine

## 2019-01-12 ENCOUNTER — Other Ambulatory Visit: Payer: Self-pay

## 2019-01-12 ENCOUNTER — Emergency Department
Admission: EM | Admit: 2019-01-12 | Discharge: 2019-01-13 | Disposition: A | Discharge status: Home / Self Care | Payer: Self-pay | Attending: Emergency Medicine | Admitting: Emergency Medicine

## 2019-01-12 DIAGNOSIS — O4690 Antepartum hemorrhage, unspecified, unspecified trimester: Secondary | ICD-10-CM

## 2019-01-12 DIAGNOSIS — Z3A00 Weeks of gestation of pregnancy not specified: Secondary | ICD-10-CM | POA: Insufficient documentation

## 2019-01-12 DIAGNOSIS — Z87891 Personal history of nicotine dependence: Secondary | ICD-10-CM | POA: Insufficient documentation

## 2019-01-12 DIAGNOSIS — Z79899 Other long term (current) drug therapy: Secondary | ICD-10-CM | POA: Insufficient documentation

## 2019-01-12 DIAGNOSIS — O200 Threatened abortion: Secondary | ICD-10-CM | POA: Insufficient documentation

## 2019-01-12 LAB — CBC WITH DIFFERENTIAL/PLATELET
Abs Immature Granulocytes: 0.06 10*3/uL (ref 0.00–0.07)
Basophils Absolute: 0 10*3/uL (ref 0.0–0.1)
Basophils Relative: 0 %
Eosinophils Absolute: 0.1 10*3/uL (ref 0.0–0.5)
Eosinophils Relative: 1 %
HCT: 44.6 % (ref 36.0–46.0)
Hemoglobin: 15 g/dL (ref 12.0–15.0)
Immature Granulocytes: 1 %
Lymphocytes Relative: 17 %
Lymphs Abs: 1.9 10*3/uL (ref 0.7–4.0)
MCH: 32.5 pg (ref 26.0–34.0)
MCHC: 33.6 g/dL (ref 30.0–36.0)
MCV: 96.7 fL (ref 80.0–100.0)
Monocytes Absolute: 0.6 10*3/uL (ref 0.1–1.0)
Monocytes Relative: 5 %
Neutro Abs: 8.6 10*3/uL — ABNORMAL HIGH (ref 1.7–7.7)
Neutrophils Relative %: 76 %
Platelets: 211 10*3/uL (ref 150–400)
RBC: 4.61 MIL/uL (ref 3.87–5.11)
RDW: 13 % (ref 11.5–15.5)
WBC: 11.3 10*3/uL — ABNORMAL HIGH (ref 4.0–10.5)
nRBC: 0 % (ref 0.0–0.2)

## 2019-01-12 LAB — HCG, QUANTITATIVE, PREGNANCY: hCG, Beta Chain, Quant, S: 658 m[IU]/mL — ABNORMAL HIGH (ref <5)

## 2019-01-12 LAB — PREGNANCY, URINE: Preg Test, Ur: POSITIVE — AB

## 2019-01-12 LAB — BASIC METABOLIC PANEL
Anion gap: 9 (ref 5–15)
BUN: 12 mg/dL (ref 6–20)
CO2: 25 mmol/L (ref 22–32)
Calcium: 8.8 mg/dL — ABNORMAL LOW (ref 8.9–10.3)
Chloride: 102 mmol/L (ref 98–111)
Creatinine, Ser: 0.72 mg/dL (ref 0.44–1.00)
GFR calc Af Amer: >60 mL/min (ref >60)
GFR calc non Af Amer: >60 mL/min (ref >60)
Glucose, Bld: 104 mg/dL — ABNORMAL HIGH (ref 70–99)
Potassium: 3.9 mmol/L (ref 3.5–5.1)
Sodium: 136 mmol/L (ref 135–145)

## 2019-01-12 LAB — ABO/RH: ABO/RH(D): O POS

## 2019-01-12 NOTE — ED Triage Notes (Signed)
Patient to ER for c/o vaginal bleeding ("normal period amount"). Patient states she is 9 weeks & 6 days pregnant. Has been seeing health department, but has not had ultrasound yet.

## 2019-01-13 NOTE — ED Provider Notes (Signed)
[facility] Emergency Department Provider Note  ____________________________________________   First MD Initiated Contact with Patient 01/12/19 2328     (approximate)  I have reviewed the triage vital signs and the nursing notes.   HISTORY  Chief Complaint Vaginal Bleeding    HPI Suzanne Davies is a 28 y.o. female with no known medical history except for history of polysubstance abuse and who is a little bit unclear about how many times she has been pregnant in the past (eventually it seems that she has had at least 1 prior miscarriage or abortion).  She has no living children.  She presents for evaluation of vaginal bleeding over the last 24 hours that she says is about the same as a normal menstrual cycle in the setting of a known pregnancy.  Based on the date of her last menstrual period, she thinks that she is 9 to [redacted] weeks pregnant.  She has been to the health department but has not yet had an ultrasound.  The bleeding started last night and she has had no traumatic incidents recently.  She has only changed her pad twice over the last 24 hours.  No clots and no products of conception.  She has had intermittent waves of cramping lower abdominal pain.  She has not tried taking anything for it.  Nothing in particular makes her symptoms better or worse.  It is severe at times.  She denies fever/chills, sore throat, chest pain, shortness of breath, cough.  She has had a couple of episodes of nausea and vomiting.  She feels better at this time.         Past Medical History:  Diagnosis Date  . Known health problems: none     Patient Active Problem List   Diagnosis Date Noted  . Polysubstance abuse (HCC) 11/01/2018    Past Surgical History:  Procedure Laterality Date  . NO PAST SURGERIES      Prior to Admission medications   Medication Sig Start Date End Date Taking? Authorizing Provider  Prenatal Vit-Fe Fumarate-FA (PRENATAL VITAMIN PO) Take 1 tablet by  mouth 3 (three) times daily with meals.    [provider]    Allergies Penicillins and Sulfa antibiotics  Family History  Problem Relation Age of Onset  . AAA (abdominal aortic aneurysm) Maternal Grandmother   . AAA (abdominal aortic aneurysm) Maternal Grandfather     Social History Social History   Tobacco Use  . Smoking status: Former Smoker    Packs/day: 0.25  . Smokeless tobacco: Never Used  . Tobacco comment: quit ~ 12/24/2018  Substance Use Topics  . Alcohol use: Not Currently    Alcohol/week: 1.0 standard drinks    Types: 1 Glasses of wine per week    Comment: 8  . Drug use: No    Review of Systems Constitutional: No fever/chills Eyes: No visual changes. ENT: No sore throat. Cardiovascular: Denies chest pain. Respiratory: Denies shortness of breath. Gastrointestinal: Intermittent abdominal cramping with occasional nausea and vomiting. Genitourinary: Vaginal bleeding as described above in the setting of early pregnancy. Musculoskeletal: Negative for neck pain.  Negative for back pain. Integumentary: Negative for rash. Neurological: Negative for headaches, focal weakness or numbness.   ____________________________________________   PHYSICAL EXAM:  VITAL SIGNS: ED Triage Vitals [01/12/19 2019]  Enc Vitals Group     BP 122/86     Pulse Rate (!) 106     Resp 20     Temp 98.1 F (36.7 C)  Temp Source Oral     SpO2 99 %     Weight 77.1 kg (170 lb)     Height 1.626 m (5\' 4" )     Head Circumference      Peak Flow      Pain Score 7     Pain Loc      Pain Edu?      Excl. in GC?     Constitutional: Alert and oriented.  No acute distress. Eyes: Conjunctivae are normal.  Head: Atraumatic. Nose: No congestion/rhinnorhea. Mouth/Throat: Patient is wearing a mask. Neck: No stridor.  No meningeal signs.   Cardiovascular: Normal rate, regular rhythm. Good peripheral circulation. Grossly normal heart sounds. Respiratory: Normal respiratory  effort.  No retractions. Gastrointestinal: Soft and nontender. No distention.  Genitourinary: Deferred based on joint decision-making with the patient. Musculoskeletal: No lower extremity tenderness nor edema. No gross deformities of extremities. Neurologic:  Normal speech and language. No gross focal neurologic deficits are appreciated.  Skin:  Skin is warm, dry and intact. Psychiatric: Mood and affect are normal. Speech and behavior are normal.  ____________________________________________   LABS (all labs ordered are listed, but only abnormal results are displayed)  Labs Reviewed  HCG, QUANTITATIVE, PREGNANCY - Abnormal; Notable for the following components:      Result Value   hCG, Beta Chain, Quant, S 658 (*)    All other components within normal limits  CBC WITH DIFFERENTIAL/PLATELET - Abnormal; Notable for the following components:   WBC 11.3 (*)    Neutro Abs 8.6 (*)    All other components within normal limits  BASIC METABOLIC PANEL - Abnormal; Notable for the following components:   Glucose, Bld 104 (*)    Calcium 8.8 (*)    All other components within normal limits  PREGNANCY, URINE - Abnormal; Notable for the following components:   Preg Test, Ur POSITIVE (*)    All other components within normal limits  POC URINE PREG, ED  ABO/RH   ____________________________________________  EKG  No indication for EKG ____________________________________________  RADIOLOGY I, Loleta Roseory Rowdy Guerrini, personally viewed and evaluated these images (plain radiographs) as part of my medical decision making, as well as reviewing the written report by the radiologist.  ED MD interpretation: Gestational sac with no fetal pole or yolk sac.  No evidence of ectopic pregnancy.  Official radiology report(s): [place] Ob Less Than 14 Weeks With Ob Transvaginal  Result Date: 01/12/2019 CLINICAL DATA:  Vaginal bleeding for 2 days with positive pregnancy test EXAM: OBSTETRIC <14 WK US AND TRANSVAGINAL OB  US TECHNIQUE: Both transabdominal and transvaginal ultrasound examinations were performed for complete evaluation of the gestation as well as the maternal uterus, adnexal regions, and pelvic cul-de-sac. Transvaginal technique was performed to assess early pregnancy. COMPARISON:  None. FINDINGS: Intrauterine gestational sac: Absent Maternal uterus/adnexae: Right ovarian cyst is noted measuring 2.9 x 2.9 x 3.6 cm. This is consistent with a simple cyst. 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 Echogenic focus is noted within the anterior uterus measuring 10 mm which may represent a small uterine fibroid. 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 A portion of the renal tissue lies posterior to the uterus. IMPRESSION: No evidence of intrauterine gestation. Probable early intrauterine gestational sac, but no yolk sac, fetal pole, or cardiac activity yet visualized. Recommend follow-up quantitative B-HCG levels and follow-up US in 14 days to assess viability. This recommendation follows SRU consensus guidelines: Diagnostic Criteria for Nonviable Pregnancy Early in the First  Trimester. [employer] Med 2013; 979:8921-19. 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 3.6 cm right ovarian cyst. Electronically Signed   By: Inez Catalina M.D.   On: 01/12/2019 23:27    ____________________________________________   PROCEDURES   Procedure(s) performed (including Critical Care):  Procedures   ____________________________________________   INITIAL IMPRESSION / MDM / [place] / ED COURSE  As part of my medical decision making, I reviewed the following data within the [place] History obtained from family, Nursing notes reviewed and incorporated, Labs reviewed , Old chart reviewed, Notes from prior ED visits and [place] Controlled Substance Database   Differential diagnosis includes,  but is not limited to, threatened abortion in early pregnancy, nonviable pregnancy, implantation bleeding, ectopic pregnancy.  The patient is well-appearing and in no distress.  No abdominal tenderness to palpation.  We discussed a pelvic exam but agreed that it was nonemergent given that she has only minimal bleeding and is stable.  I had my usual customary threatened abortion discussion with the patient based on an ultrasound that likely represents an early pregnancy but I also explained to her that this could be a nonviable pregnancy and she is in the process of having a miscarriage.  I encourage the use of Tylenol, heating pads, and close follow-up with OB/GYN for repeat ultrasound and beta-hCG in about 2 weeks.  I gave my usual and customary return precautions.  She understands and agrees with the plan.      Clinical Course as of Jan 13 23  Wed Jan 13, 2019  0016 No indication for RhoGam  ABO/RH(D): O POS Performed at [facility], [address] [place], [place] [address]  [CF]    Clinical Course User Index [CF] Hinda Kehr, MD     ____________________________________________  FINAL CLINICAL IMPRESSION(S) / ED DIAGNOSES  Final diagnoses:  Threatened miscarriage     MEDICATIONS GIVEN DURING THIS VISIT:  Medications - No data to display   ED Discharge Orders    None      *Please note:  Jennae Hakeem was evaluated in Emergency Department on 01/13/2019 for the symptoms described in the history of present illness. She was evaluated in the context of the global COVID-19 pandemic, which necessitated consideration that the patient might be at risk for infection with the SARS-CoV-2 virus that causes COVID-19. Institutional protocols and algorithms that pertain to the evaluation of patients at risk for COVID-19 are in a state of rapid change based on information released by regulatory bodies including the CDC and federal and state organizations. These policies and  algorithms were followed during the patient's care in the ED.  Some ED evaluations and interventions may be delayed as a result of limited staffing during the pandemic.*  Note:  This document was prepared using Dragon voice recognition software and may include unintentional dictation errors.   Hinda Kehr, MD 01/13/19 [phone]

## 2019-01-13 NOTE — Discharge Instructions (Signed)
You have been seen in the Emergency Department (ED) for vaginal bleeding during pregnancy, which is called a threatened abortion or "threatened miscarriage".  Fortunately, your evaluation was reassuring, but it appears that you are too early in your pregnancy to see a fetus within your uterus.  Please start taking prenatal vitamins (over the counter) if you are not already doing so.  As a result of your blood type, you did not receive an injection of medication called Rhogam - please let your OB/Gyn know.  You should call to schedule a follow-up appointment for about 2 weeks from now so that you can have a repeat blood pregnancy test and a repeat ultrasound to see if the baby is developing.  If you are having a miscarriage, you can expect to have persistent bleeding and likely persistent abdominal pain and cramping.  Please follow up as recommended above.  If you develop any other symptoms that concern you (including, but not limited to, persistent vomiting, worsening bleeding, abdominal or pelvic pain, or fever greater than 101), please return immediately to the Emergency Department.

## 2019-01-20 ENCOUNTER — Telehealth: Payer: Self-pay

## 2019-01-20 NOTE — Telephone Encounter (Signed)
Attempted to call client-01/12/19 ED notes reviewed & per Ola Spurr, CNM client needs repeat B HCG if has not been elsewhere for f/u; no answer, left voicemail message Debera Lat, RN

## 2019-01-21 NOTE — Telephone Encounter (Signed)
Attempted to call again per above note; no answer & "mailbox full" unable to leave message Debera Lat, RN

## 2019-01-22 ENCOUNTER — Other Ambulatory Visit: Payer: Self-pay

## 2019-01-22 DIAGNOSIS — Z20822 Contact with and (suspected) exposure to covid-19: Secondary | ICD-10-CM

## 2019-01-22 NOTE — Telephone Encounter (Signed)
Attempt to call #3-no answer & unable to leave voicemail; call returned by client's phone number and answered-not acknowledged and call disconnected Debera Lat, RN

## 2019-01-23 LAB — NOVEL CORONAVIRUS, NAA: SARS-CoV-2, NAA: NOT DETECTED

## 2019-01-26 ENCOUNTER — Ambulatory Visit: Payer: Self-pay

## 2019-03-11 ENCOUNTER — Other Ambulatory Visit: Payer: Self-pay

## 2019-03-12 ENCOUNTER — Other Ambulatory Visit: Payer: Self-pay

## 2019-03-15 ENCOUNTER — Other Ambulatory Visit: Payer: Self-pay

## 2019-05-20 ENCOUNTER — Ambulatory Visit: Payer: Self-pay

## 2019-06-27 ENCOUNTER — Observation Stay
Admission: EM | Admit: 2019-06-27 | Discharge: 2019-06-28 | Disposition: A | Discharge status: Home / Self Care | Payer: Self-pay | Attending: Surgery | Admitting: Surgery

## 2019-06-27 ENCOUNTER — Emergency Department: Payer: Self-pay

## 2019-06-27 ENCOUNTER — Encounter: Payer: Self-pay | Admitting: Emergency Medicine

## 2019-06-27 ENCOUNTER — Other Ambulatory Visit: Payer: Self-pay

## 2019-06-27 DIAGNOSIS — Z880 Allergy status to penicillin: Secondary | ICD-10-CM | POA: Insufficient documentation

## 2019-06-27 DIAGNOSIS — Z882 Allergy status to sulfonamides status: Secondary | ICD-10-CM | POA: Insufficient documentation

## 2019-06-27 DIAGNOSIS — Z87891 Personal history of nicotine dependence: Secondary | ICD-10-CM | POA: Insufficient documentation

## 2019-06-27 DIAGNOSIS — R112 Nausea with vomiting, unspecified: Secondary | ICD-10-CM

## 2019-06-27 DIAGNOSIS — Z20822 Contact with and (suspected) exposure to covid-19: Secondary | ICD-10-CM | POA: Insufficient documentation

## 2019-06-27 DIAGNOSIS — K8000 Calculus of gallbladder with acute cholecystitis without obstruction: Principal | ICD-10-CM | POA: Diagnosis present

## 2019-06-27 DIAGNOSIS — R1011 Right upper quadrant pain: Secondary | ICD-10-CM

## 2019-06-27 LAB — COMPREHENSIVE METABOLIC PANEL
ALT: 31 U/L (ref 0–44)
AST: 26 U/L (ref 15–41)
Albumin: 4.4 g/dL (ref 3.5–5.0)
Alkaline Phosphatase: 65 U/L (ref 38–126)
Anion gap: 11 (ref 5–15)
BUN: 14 mg/dL (ref 6–20)
CO2: 21 mmol/L — ABNORMAL LOW (ref 22–32)
Calcium: 9.2 mg/dL (ref 8.9–10.3)
Chloride: 104 mmol/L (ref 98–111)
Creatinine, Ser: 0.78 mg/dL (ref 0.44–1.00)
GFR calc Af Amer: >60 mL/min (ref >60)
GFR calc non Af Amer: >60 mL/min (ref >60)
Glucose, Bld: 148 mg/dL — ABNORMAL HIGH (ref 70–99)
Potassium: 3.9 mmol/L (ref 3.5–5.1)
Sodium: 136 mmol/L (ref 135–145)
Total Bilirubin: 0.7 mg/dL (ref 0.3–1.2)
Total Protein: 7.2 g/dL (ref 6.5–8.1)

## 2019-06-27 LAB — RESPIRATORY PANEL BY RT PCR (FLU A&B, COVID)
Influenza A by PCR: NEGATIVE
Influenza B by PCR: NEGATIVE
SARS Coronavirus 2 by RT PCR: NEGATIVE

## 2019-06-27 LAB — URINALYSIS, COMPLETE (UACMP) WITH MICROSCOPIC
Bilirubin Urine: NEGATIVE
Glucose, UA: NEGATIVE mg/dL
Hgb urine dipstick: NEGATIVE
Ketones, ur: 20 mg/dL — AB
Leukocytes,Ua: NEGATIVE
Nitrite: NEGATIVE
Protein, ur: NEGATIVE mg/dL
Specific Gravity, Urine: >1.046 — ABNORMAL HIGH (ref 1.005–1.030)
pH: 7 (ref 5.0–8.0)

## 2019-06-27 LAB — CBC
HCT: 46 % (ref 36.0–46.0)
Hemoglobin: 16.3 g/dL — ABNORMAL HIGH (ref 12.0–15.0)
MCH: 33.7 pg (ref 26.0–34.0)
MCHC: 35.4 g/dL (ref 30.0–36.0)
MCV: 95.2 fL (ref 80.0–100.0)
Platelets: 221 10*3/uL (ref 150–400)
RBC: 4.83 MIL/uL (ref 3.87–5.11)
RDW: 12.9 % (ref 11.5–15.5)
WBC: 13.3 10*3/uL — ABNORMAL HIGH (ref 4.0–10.5)
nRBC: 0 % (ref 0.0–0.2)

## 2019-06-27 LAB — HCG, QUANTITATIVE, PREGNANCY: hCG, Beta Chain, Quant, S: <1 m[IU]/mL (ref <5)

## 2019-06-27 LAB — LIPASE, BLOOD: Lipase: 37 U/L (ref 11–51)

## 2019-06-27 MED ORDER — ACETAMINOPHEN 500 MG PO TABS
1000.0000 mg | ORAL_TABLET | Freq: Once | ORAL | Status: DC
  Filled 2019-06-27: qty 2

## 2019-06-27 MED ORDER — HYDROMORPHONE HCL 1 MG/ML IJ SOLN
0.5000 mg | Freq: Once | INTRAMUSCULAR | Status: AC
  Administered 2019-06-27: 0.5 mg via INTRAVENOUS
  Filled 2019-06-27: qty 1

## 2019-06-27 MED ORDER — SUCRALFATE 1 G PO TABS
1.0000 g | ORAL_TABLET | Freq: Once | ORAL | Status: AC
  Administered 2019-06-27: 17:00:00 1 g via ORAL
  Filled 2019-06-27: qty 1

## 2019-06-27 MED ORDER — FENTANYL CITRATE (PF) 100 MCG/2ML IJ SOLN
50.0000 ug | INTRAMUSCULAR | Status: DC | PRN
  Administered 2019-06-27: 10:00:00 50 ug via INTRAVENOUS
  Filled 2019-06-27: qty 2

## 2019-06-27 MED ORDER — PANTOPRAZOLE SODIUM 40 MG IV SOLR
40.0000 mg | Freq: Once | INTRAVENOUS | Status: AC
  Administered 2019-06-27: 40 mg via INTRAVENOUS
  Filled 2019-06-27: qty 40

## 2019-06-27 MED ORDER — ONDANSETRON 4 MG PO TBDP: 4.0000 mg | ORAL_TABLET | Freq: Four times a day (QID) | ORAL | Status: DC | PRN

## 2019-06-27 MED ORDER — ONDANSETRON HCL 4 MG/2ML IJ SOLN: 4.0000 mg | Freq: Four times a day (QID) | INTRAMUSCULAR | Status: DC | PRN

## 2019-06-27 MED ORDER — SODIUM CHLORIDE 0.9% FLUSH: 3.0000 mL | Freq: Once | INTRAVENOUS | Status: DC

## 2019-06-27 MED ORDER — ZOLPIDEM TARTRATE 5 MG PO TABS: 5.0000 mg | ORAL_TABLET | Freq: Every evening | ORAL | Status: DC | PRN

## 2019-06-27 MED ORDER — SUCRALFATE 1 G PO TABS
1.0000 g | ORAL_TABLET | Freq: Once | ORAL | Status: DC
  Filled 2019-06-27: qty 1

## 2019-06-27 MED ORDER — SODIUM CHLORIDE 0.9 % IV SOLN: INTRAVENOUS | Status: DC

## 2019-06-27 MED ORDER — HYDROMORPHONE HCL 1 MG/ML IJ SOLN
0.5000 mg | INTRAMUSCULAR | Status: DC | PRN
  Administered 2019-06-28 (×2): .5 mg via INTRAVENOUS

## 2019-06-27 MED ORDER — OXYCODONE HCL 5 MG PO TABS: 5.0000 mg | ORAL_TABLET | ORAL | Status: DC | PRN

## 2019-06-27 MED ORDER — ONDANSETRON HCL 4 MG/2ML IJ SOLN
4.0000 mg | Freq: Once | INTRAMUSCULAR | Status: AC
  Administered 2019-06-27: 10:00:00 4 mg via INTRAVENOUS
  Filled 2019-06-27: qty 2

## 2019-06-27 MED ORDER — SODIUM CHLORIDE 0.9 % IV BOLUS
1000.0000 mL | Freq: Once | INTRAVENOUS | Status: AC
  Administered 2019-06-27: 1000 mL via INTRAVENOUS

## 2019-06-27 MED ORDER — CIPROFLOXACIN IN D5W 400 MG/200ML IV SOLN
400.0000 mg | Freq: Two times a day (BID) | INTRAVENOUS | Status: DC
  Administered 2019-06-28 (×2): 400 mg via INTRAVENOUS
  Filled 2019-06-27 (×4): qty 200

## 2019-06-27 MED ORDER — IOHEXOL 300 MG/ML  SOLN
100.0000 mL | Freq: Once | INTRAMUSCULAR | Status: AC | PRN
  Administered 2019-06-27: 14:00:00 100 mL via INTRAVENOUS

## 2019-06-27 MED ORDER — ACETAMINOPHEN 500 MG PO TABS
1000.0000 mg | ORAL_TABLET | Freq: Once | ORAL | Status: AC
  Administered 2019-06-27: 1000 mg via ORAL
  Filled 2019-06-27: qty 2

## 2019-06-27 MED ORDER — LIDOCAINE VISCOUS HCL 2 % MT SOLN
15.0000 mL | Freq: Once | OROMUCOSAL | Status: AC
  Administered 2019-06-27: 15 mL via ORAL
  Filled 2019-06-27: qty 15

## 2019-06-27 MED ORDER — SODIUM CHLORIDE 0.9 % IV BOLUS: 1000.0000 mL | Freq: Once | INTRAVENOUS | Status: DC

## 2019-06-27 MED ORDER — ALUM & MAG HYDROXIDE-SIMETH 200-200-20 MG/5ML PO SUSP
30.0000 mL | Freq: Once | ORAL | Status: AC
  Administered 2019-06-27: 15:00:00 30 mL via ORAL
  Filled 2019-06-27: qty 30

## 2019-06-27 NOTE — ED Notes (Signed)
This RN to subwait to speak with patient. Pt noted to be writhing around in chair. This RN explained to patient that she just received pain medication. Initially patient was calm and asleep in recliner in subwait after receiving medication. Pt now states that the medication did not work and wants more pain medication, explained to patient she just received pain medication and too soon for another dose.

## 2019-06-27 NOTE — ED Notes (Signed)
Father at bedside with patient. Updated both on plan of care/verbalize understanding.  No acute changes noted.  Pt medicated per orders for pain.  To ct via gurney at this time.

## 2019-06-27 NOTE — ED Notes (Signed)
Pt had episode of vomitting shortly after adminstering medications ordered. Dr. Fuller Plan made aware, no further changes or s/s of distress noted at this time.  Will continue to monitor/reases.

## 2019-06-27 NOTE — ED Provider Notes (Signed)
-----------------------------------------   5:06 PM on 06/27/2019 -----------------------------------------  Patient care assumed from Dr. Alfred Levins.  Patient's work-up has resulted showing a negative Covid test.  Urine is pending.  On my examination patient is moderately tender to the right upper quadrant with no other significant tenderness identified on my exam.  Given the CT scan showing a gallstone with right upper quadrant tenderness and leukocytosis we will proceed with right upper quadrant ultrasound rule out cholecystitis.  Patient agreeable to plan of care.  Urinalysis pending at this time to rule out pyelonephritis.  Ultrasound equivocal but does show gallbladder wall thickening with pericholecystic fluid.  I spoke to Dr. Claudine Mouton who will be admitting to his service for pain control and possible cholecystectomy in the morning.  Spoke to patient and father they are agreeable with this plan of care.   Minna Antis, MD 06/27/19 [phone]

## 2019-06-27 NOTE — ED Provider Notes (Signed)
[facility] Emergency Department Provider Note  ____________________________________________   First MD Initiated Contact with Patient 06/27/19 1055     (approximate)  I have reviewed the triage vital signs and the nursing notes.   HISTORY  Chief Complaint Abdominal Pain    HPI Suzanne Davies is a 29 y.o. female who is otherwise healthy who comes in with upper quadrant pain.  Patient states the pain started today, severe, constant, nothing makes it better, nothing makes it worse.  Does endorse some nausea and vomiting.  No diarrhea.  No chest pain, shortness of breath.  Patient states that she does not drink alcohol.  She does report a history of substance abuse with cocaine denies any heroin or being on Suboxone          Past Medical History:  Diagnosis Date  . Known health problems: none     Patient Active Problem List   Diagnosis Date Noted  . Polysubstance abuse (HCC) 11/01/2018    Past Surgical History:  Procedure Laterality Date  . NO PAST SURGERIES      Prior to Admission medications   Medication Sig Start Date End Date Taking? Authorizing Provider  Prenatal Vit-Fe Fumarate-FA (PRENATAL VITAMIN PO) Take 1 tablet by mouth 3 (three) times daily with meals.    [provider]    Allergies Penicillins and Sulfa antibiotics  Family History  Problem Relation Age of Onset  . AAA (abdominal aortic aneurysm) Maternal Grandmother   . AAA (abdominal aortic aneurysm) Maternal Grandfather     Social History Social History   Tobacco Use  . Smoking status: Former Smoker    Packs/day: 0.25  . Smokeless tobacco: Never Used  . Tobacco comment: quit ~ 12/24/2018  Substance Use Topics  . Alcohol use: Not Currently    Alcohol/week: 1.0 standard drinks    Types: 1 Glasses of wine per week    Comment: 8  . Drug use: No      Review of Systems Constitutional: No fever/chills Eyes: No visual changes. ENT: No sore  throat. Cardiovascular: Denies chest pain. Respiratory: Denies shortness of breath. Gastrointestinal: Positive abdominal pain and vomiting no diarrhea.  No constipation. Genitourinary: Negative for dysuria. Musculoskeletal: Negative for back pain. Skin: Negative for rash. Neurological: Negative for headaches, focal weakness or numbness. All other ROS negative ____________________________________________   PHYSICAL EXAM:  VITAL SIGNS: ED Triage Vitals  Enc Vitals Group     BP 06/27/19 0942 117/86     Pulse Rate 06/27/19 0942 81     Resp 06/27/19 0942 16     Temp 06/27/19 0942 (!) 97.2 F (36.2 C)     Temp src --      SpO2 06/27/19 0942 98 %     Weight 06/27/19 0950 171 lb (77.6 kg)     Height 06/27/19 0950 5\' 4"  (1.626 m)     Head Circumference --      Peak Flow --      Pain Score 06/27/19 0949 10     Pain Loc --      Pain Edu? --      Excl. in GC? --     Constitutional: Alert and oriented.  Patient appears to be in pain Eyes: Conjunctivae are normal. EOMI. Head: Atraumatic. Nose: No congestion/rhinnorhea. Mouth/Throat: Mucous membranes are moist.   Neck: No stridor. Trachea Midline. FROM Cardiovascular: Normal rate, regular rhythm. Grossly normal heart sounds.  Good peripheral circulation. Respiratory: Normal respiratory effort.  No retractions. Lungs CTAB.  Gastrointestinal: Tender in the upper abdomen no distention. No abdominal bruits.  Musculoskeletal: No lower extremity tenderness nor edema.  No joint effusions. Neurologic:  Normal speech and language. No gross focal neurologic deficits are appreciated.  Skin:  Skin is warm, dry and intact. No rash noted. Psychiatric: Mood and affect are normal. Speech and behavior are normal. GU: Deferred   ____________________________________________   LABS (all labs ordered are listed, but only abnormal results are displayed)  Labs Reviewed  COMPREHENSIVE METABOLIC PANEL - Abnormal; Notable for the following components:       Result Value   CO2 21 (*)    Glucose, Bld 148 (*)    All other components within normal limits  CBC - Abnormal; Notable for the following components:   WBC 13.3 (*)    Hemoglobin 16.3 (*)    All other components within normal limits  RESPIRATORY PANEL BY RT PCR (FLU A&B, COVID)  LIPASE, BLOOD  HCG, QUANTITATIVE, PREGNANCY  URINALYSIS, COMPLETE (UACMP) WITH MICROSCOPIC  POC URINE PREG, ED   ____________________________________________   RADIOLOGY   Official radiology report(s): CT ABDOMEN PELVIS W CONTRAST  Result Date: 06/27/2019 CLINICAL DATA:  Nausea and vomiting with left lower quadrant pain EXAM: CT ABDOMEN AND PELVIS WITH CONTRAST TECHNIQUE: Multidetector CT imaging of the abdomen and pelvis was performed using the standard protocol following bolus administration of intravenous contrast. CONTRAST:  116mL OMNIPAQUE IOHEXOL 300 MG/ML  SOLN COMPARISON:  None. FINDINGS: Lower chest: There is an ill-defined area of airspace opacity in the anterior segment of the right lower lobe. There is also subtle ill-defined opacity in portions of the lateral and posterior segments of the right lower lobe. Lung bases otherwise are clear. Hepatobiliary: There is mild fatty infiltration near the fissure for the ligamentum teres. No other focal liver lesions are evident. Tiny nitrogen containing gallstones noted. No gallbladder wall thickening. No appreciable biliary duct dilatation. Pancreas: No pancreatic mass or inflammatory focus. Spleen: Spleen appears normal. Adrenals/Urinary Tract: Renals appear normal. There is no evidence of kidney in either flank region. In the pelvis, there is apparent cross fused ectopia with kidney located anterior to the upper sacrum. There is normal renal enhancement of this cross fused ectopic kidney. No renal mass evident. There is mild hydronephrosis in the left renal moiety. No evident but is no intrarenal calculus evident. No ureteral calculus. A small calcification  immediately lateral to the urinary bladder is a likely phlebolith. It does not appear to reside within ureter. Urinary bladder is midline with wall thickness within normal limits. Stomach/Bowel: No appreciable diverticular disease. No evident bowel wall or mesenteric thickening. There is no evident bowel obstruction. Terminal ileum appears normal. There is no free air or portal venous air. Vascular/Lymphatic: No abdominal aortic aneurysm. No arterial vascular calcifications evident. Major venous structures appear patent. There is no evident adenopathy in the abdomen or pelvis. Reproductive: Uterus is anteverted.  No evident pelvic mass. Other: Appendix appears normal. No abscess or ascites evident in the abdomen or pelvis. Musculoskeletal: No blastic or lytic bone lesions. No intramuscular or abdominal wall lesions. IMPRESSION: 1. 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 There is mild hydronephrosis involving the left cross fused ectopic moiety. No renal or ureteral calculus is appreciable. Urinary bladder wall thickness is normal. Question recent calculus passage on the left. Early pyelonephritis involving the left renal moiety could present in this manner. No evidence suggesting renal abscess. No perinephric fluid evident. 2. Areas of ill-defined airspace  opacity in the right lower lobe. Question atypical organism pneumonia. Check of COVID-19 status advised in this regard. 3.  Tiny nitrogen containing gallstones noted. 4. No bowel obstruction. No abscess in the abdomen or pelvis. Appendix appears normal. Electronically Signed   By: Bretta Bang III M.D.   On: 06/27/2019 13:55   DG Chest Portable 1 View  Result Date: 06/27/2019 CLINICAL DATA:  Shortness of breath.  Nausea and vomiting EXAM: PORTABLE CHEST 1 VIEW COMPARISON:  None. FINDINGS: Lungs are clear. Heart size and pulmonary vascularity are normal. No adenopathy. No bone lesions. IMPRESSION: No abnormality noted.  Electronically Signed   By: Bretta Bang III M.D.   On: 06/27/2019 15:19    ____________________________________________   PROCEDURES  Procedure(s) performed (including Critical Care):  Procedures   ____________________________________________   INITIAL IMPRESSION / ASSESSMENT AND PLAN / ED COURSE  Suzanne Davies was evaluated in Emergency Department on 06/27/2019 for the symptoms described in the history of present illness. She was evaluated in the context of the global COVID-19 pandemic, which necessitated consideration that the patient might be at risk for infection with the SARS-CoV-2 virus that causes COVID-19. Institutional protocols and algorithms that pertain to the evaluation of patients at risk for COVID-19 are in a state of rapid change based on information released by regulatory bodies including the CDC and federal and state organizations. These policies and algorithms were followed during the patient's care in the ED.     Patient is a 29 year old who comes in with acute abdominal pain in the upper abdomen.  Given patient's extensive pain I think it be best routine with CT scan to make sure no evidence of appendicitis causing perforation, obstruction, cholecystitis.  Patient states that she can urinate will get pregnancy test to make sure no signs of ectopic.  We will give an IV dose of Dilaudid while pending CT scan imaging.  Labs are reassuring save for slightly elevated white count.  CT imaging notable for cross fused ectopic kidney.  Patient also has a little bit of hydro on the left.  There was also concern for right lower lobe possible pneumonia and recommended Covid testing.  Also concern for tiny nitrogen-containing gallstone. went to reevaluate patient and most of her pain seems to be on the right flank with a right upper quadrant.  Patient does use some NSAIDs.  Will cover for possible ulcer as well with some GI cocktail Tylenol Carafate.  Patient vomited after the GI  cocktail did not keep these medicines down so we will trial meds without the GI cocktail.  Difficult to tell what is causing patient's pain.  Still waiting for urine to evaluate for pyelonephritis, possibility of pneumonia/Covid results are still pending, possibly biliary colic related but her pain has been going on constantly since 7 AM.  There is no signs of cholecystitis and her LFTs are normal therefore unlikely to be choledocholithiasis.  Patient's dad is at bedside who stated that she is also had some chills and congestion for the past 1 week.  We discussed giving the rest of her urine back and patient will be handed off to oncoming team pending reevaluation.  If patient's pain is uncontrolled or not tolerating p.o. patient may require admission for symptomatic management.          ____________________________________________   FINAL CLINICAL IMPRESSION(S) / ED DIAGNOSES   Final diagnoses:  None      MEDICATIONS GIVEN DURING THIS VISIT:  Medications  sodium chloride flush (NS) 0.9 %  injection 3 mL (has no administration in time range)  fentaNYL (SUBLIMAZE) injection 50 mcg (50 mcg Intravenous Given 06/27/19 1005)  ondansetron (ZOFRAN) injection 4 mg (4 mg Intravenous Given 06/27/19 1005)  sodium chloride 0.9 % bolus 1,000 mL (1,000 mLs Intravenous New Bag/Given 06/27/19 1005)     ED Discharge Orders    None       Note:  This document was prepared using Dragon voice recognition software and may include unintentional dictation errors.   Concha Se, MD 06/27/19 1536

## 2019-06-27 NOTE — ED Notes (Signed)
[place] at bedside at this time.  No acute changes noted.  Pt and father updated on plan of care/verbalized understanding.  Will continue to monitor/reassess.

## 2019-06-27 NOTE — ED Triage Notes (Signed)
Pt to ER with c/o LUQ  Abdominal pain that started around 0830.  Pt also reports n/v. Pt appears pale and is diaphoretic in triage.  Noted distress at this time.

## 2019-06-27 NOTE — Discharge Instructions (Signed)
1. 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 There is mild hydronephrosis involving the left cross fused ectopic moiety. No renal or ureteral calculus is appreciable. Urinary bladder wall thickness is normal. Question recent calculus passage on the left. Early pyelonephritis involving the left renal moiety could present in this manner. No evidence suggesting renal abscess. No perinephric fluid evident.   2. Areas of ill-defined airspace opacity in the right lower lobe. Question atypical organism pneumonia. Check of COVID-19 status advised in this regard.   3.  Tiny nitrogen containing gallstones noted.   4. No bowel obstruction. No abscess in the abdomen or pelvis. Appendix appears normal.

## 2019-06-27 NOTE — ED Notes (Signed)
This RN notified by Gerilyn Pilgrim, EDT that patient requesting more pain medication.

## 2019-06-28 ENCOUNTER — Encounter
Admission: EM | Disposition: A | Discharge status: Home / Self Care | Payer: Self-pay | Source: Home / Self Care | Attending: Emergency Medicine

## 2019-06-28 ENCOUNTER — Observation Stay: Payer: Self-pay | Admitting: Certified Registered Nurse Anesthetist

## 2019-06-28 ENCOUNTER — Encounter: Payer: Self-pay | Admitting: Surgery

## 2019-06-28 DIAGNOSIS — K8020 Calculus of gallbladder without cholecystitis without obstruction: Secondary | ICD-10-CM

## 2019-06-28 DIAGNOSIS — K8000 Calculus of gallbladder with acute cholecystitis without obstruction: Secondary | ICD-10-CM

## 2019-06-28 LAB — URINE DRUG SCREEN, QUALITATIVE (ARMC ONLY)
Amphetamines, Ur Screen: POSITIVE — AB
Barbiturates, Ur Screen: NOT DETECTED
Benzodiazepine, Ur Scrn: NOT DETECTED
Cannabinoid 50 Ng, Ur ~~LOC~~: POSITIVE — AB
Cocaine Metabolite,Ur ~~LOC~~: NOT DETECTED
MDMA (Ecstasy)Ur Screen: NOT DETECTED
Methadone Scn, Ur: NOT DETECTED
Opiate, Ur Screen: NOT DETECTED
Phencyclidine (PCP) Ur S: NOT DETECTED
Tricyclic, Ur Screen: NOT DETECTED

## 2019-06-28 LAB — SURGICAL PCR SCREEN
MRSA, PCR: NEGATIVE
Staphylococcus aureus: POSITIVE — AB

## 2019-06-28 SURGERY — CHOLECYSTECTOMY, ROBOT-ASSISTED, LAPAROSCOPIC
Anesthesia: General | Site: Abdomen

## 2019-06-28 MED ORDER — BUPIVACAINE HCL (PF) 0.25 % IJ SOLN
INTRAMUSCULAR | Status: AC
  Filled 2019-06-28: qty 30

## 2019-06-28 MED ORDER — BUPIVACAINE LIPOSOME 1.3 % IJ SUSP
INTRAMUSCULAR | Status: DC | PRN
  Administered 2019-06-28: 15 mL
  Administered 2019-06-28: 5 mL

## 2019-06-28 MED ORDER — FENTANYL CITRATE (PF) 100 MCG/2ML IJ SOLN
INTRAMUSCULAR | Status: AC
  Administered 2019-06-28: 22:00:00 50 ug via INTRAVENOUS
  Filled 2019-06-28: qty 2

## 2019-06-28 MED ORDER — ROCURONIUM BROMIDE 100 MG/10ML IV SOLN
INTRAVENOUS | Status: DC | PRN
  Administered 2019-06-28: 50 mg via INTRAVENOUS

## 2019-06-28 MED ORDER — MIDAZOLAM HCL 2 MG/2ML IJ SOLN
INTRAMUSCULAR | Status: AC
  Filled 2019-06-28: qty 2

## 2019-06-28 MED ORDER — PROPOFOL 10 MG/ML IV BOLUS
INTRAVENOUS | Status: AC
  Filled 2019-06-28: qty 20

## 2019-06-28 MED ORDER — IBUPROFEN 800 MG PO TABS: 800.0000 mg | ORAL_TABLET | Freq: Three times a day (TID) | ORAL | 0 refills | Status: AC | PRN

## 2019-06-28 MED ORDER — PROPOFOL 10 MG/ML IV BOLUS
INTRAVENOUS | Status: DC | PRN
  Administered 2019-06-28: 200 mg via INTRAVENOUS

## 2019-06-28 MED ORDER — FENTANYL CITRATE (PF) 100 MCG/2ML IJ SOLN
INTRAMUSCULAR | Status: AC
  Filled 2019-06-28: qty 2

## 2019-06-28 MED ORDER — FENTANYL CITRATE (PF) 100 MCG/2ML IJ SOLN
INTRAMUSCULAR | Status: DC | PRN
  Administered 2019-06-28 (×4): 50 ug via INTRAVENOUS

## 2019-06-28 MED ORDER — DEXMEDETOMIDINE HCL 200 MCG/2ML IV SOLN
INTRAVENOUS | Status: DC | PRN
  Administered 2019-06-28: 2 ug via INTRAVENOUS
  Administered 2019-06-28 (×2): 4 ug via INTRAVENOUS
  Administered 2019-06-28: 2 ug via INTRAVENOUS

## 2019-06-28 MED ORDER — EPINEPHRINE PF 1 MG/ML IJ SOLN
INTRAMUSCULAR | Status: AC
  Filled 2019-06-28: qty 1

## 2019-06-28 MED ORDER — BUPIVACAINE-EPINEPHRINE (PF) 0.25% -1:200000 IJ SOLN
INTRAMUSCULAR | Status: DC | PRN
  Administered 2019-06-28: 25 mL
  Administered 2019-06-28: 5 mL

## 2019-06-28 MED ORDER — ONDANSETRON HCL 4 MG/2ML IJ SOLN
INTRAMUSCULAR | Status: AC
  Filled 2019-06-28: qty 2

## 2019-06-28 MED ORDER — HYDROCODONE-ACETAMINOPHEN 5-325 MG PO TABS: 2.0000 | ORAL_TABLET | Freq: Four times a day (QID) | ORAL | 0 refills | Status: AC | PRN

## 2019-06-28 MED ORDER — MUPIROCIN 2 % EX OINT
1.0000 "application " | TOPICAL_OINTMENT | Freq: Two times a day (BID) | CUTANEOUS | Status: DC
  Administered 2019-06-28: 1 via NASAL
  Filled 2019-06-28: qty 22

## 2019-06-28 MED ORDER — ONDANSETRON HCL 4 MG/2ML IJ SOLN
INTRAMUSCULAR | Status: DC | PRN
  Administered 2019-06-28: 4 mg via INTRAVENOUS

## 2019-06-28 MED ORDER — FENTANYL CITRATE (PF) 100 MCG/2ML IJ SOLN
25.0000 ug | INTRAMUSCULAR | Status: DC | PRN
  Administered 2019-06-28 (×3): 25 ug via INTRAVENOUS

## 2019-06-28 MED ORDER — LIDOCAINE HCL (CARDIAC) PF 100 MG/5ML IV SOSY
PREFILLED_SYRINGE | INTRAVENOUS | Status: DC | PRN
  Administered 2019-06-28: 100 mg via INTRAVENOUS

## 2019-06-28 MED ORDER — HYDROMORPHONE HCL 1 MG/ML IJ SOLN
INTRAMUSCULAR | Status: AC
  Filled 2019-06-28: qty 1

## 2019-06-28 MED ORDER — CHLORHEXIDINE GLUCONATE CLOTH 2 % EX PADS: 6.0000 | MEDICATED_PAD | Freq: Every day | CUTANEOUS | Status: DC

## 2019-06-28 MED ORDER — ACETAMINOPHEN 10 MG/ML IV SOLN
INTRAVENOUS | Status: DC | PRN
  Administered 2019-06-28: 1000 mg via INTRAVENOUS

## 2019-06-28 MED ORDER — PHENYLEPHRINE HCL (PRESSORS) 10 MG/ML IV SOLN
INTRAVENOUS | Status: DC | PRN
  Administered 2019-06-28: 100 ug via INTRAVENOUS

## 2019-06-28 MED ORDER — INDOCYANINE GREEN 25 MG IV SOLR
1.2500 mg | Freq: Once | INTRAVENOUS | Status: AC
  Administered 2019-06-28: 19:00:00 1.25 mg via INTRAVENOUS
  Filled 2019-06-28: qty 10

## 2019-06-28 MED ORDER — FENTANYL CITRATE (PF) 100 MCG/2ML IJ SOLN
INTRAMUSCULAR | Status: AC
  Administered 2019-06-28: 22:00:00 25 ug via INTRAVENOUS
  Filled 2019-06-28: qty 2

## 2019-06-28 MED ORDER — SUGAMMADEX SODIUM 500 MG/5ML IV SOLN
INTRAVENOUS | Status: AC
  Filled 2019-06-28: qty 5

## 2019-06-28 MED ORDER — LIDOCAINE HCL (PF) 2 % IJ SOLN
INTRAMUSCULAR | Status: AC
  Filled 2019-06-28: qty 5

## 2019-06-28 MED ORDER — ROCURONIUM BROMIDE 10 MG/ML (PF) SYRINGE
PREFILLED_SYRINGE | INTRAVENOUS | Status: AC
  Filled 2019-06-28: qty 10

## 2019-06-28 MED ORDER — DEXAMETHASONE SODIUM PHOSPHATE 10 MG/ML IJ SOLN
INTRAMUSCULAR | Status: AC
  Filled 2019-06-28: qty 1

## 2019-06-28 MED ORDER — BUPIVACAINE LIPOSOME 1.3 % IJ SUSP
INTRAMUSCULAR | Status: AC
  Filled 2019-06-28: qty 20

## 2019-06-28 MED ORDER — LACTATED RINGERS IV SOLN: INTRAVENOUS | Status: DC

## 2019-06-28 MED ORDER — FENTANYL CITRATE (PF) 100 MCG/2ML IJ SOLN
25.0000 ug | INTRAMUSCULAR | Status: AC | PRN
  Administered 2019-06-28 (×2): 25 ug via INTRAVENOUS

## 2019-06-28 MED ORDER — DEXMEDETOMIDINE HCL IN NACL 80 MCG/20ML IV SOLN
INTRAVENOUS | Status: AC
  Filled 2019-06-28: qty 20

## 2019-06-28 MED ORDER — ACETAMINOPHEN 10 MG/ML IV SOLN
INTRAVENOUS | Status: AC
  Filled 2019-06-28: qty 100

## 2019-06-28 MED ORDER — DEXAMETHASONE SODIUM PHOSPHATE 10 MG/ML IJ SOLN
INTRAMUSCULAR | Status: DC | PRN
  Administered 2019-06-28: 10 mg via INTRAVENOUS

## 2019-06-28 MED ORDER — PROMETHAZINE HCL 25 MG/ML IJ SOLN: 6.2500 mg | INTRAMUSCULAR | Status: DC | PRN

## 2019-06-28 MED ORDER — MIDAZOLAM HCL 2 MG/2ML IJ SOLN
INTRAMUSCULAR | Status: DC | PRN
  Administered 2019-06-28: 2 mg via INTRAVENOUS

## 2019-06-28 MED ORDER — SODIUM CHLORIDE 0.9 % IR SOLN
Status: DC | PRN
  Administered 2019-06-28: 3000 mL

## 2019-06-28 MED ORDER — SUGAMMADEX SODIUM 200 MG/2ML IV SOLN
INTRAVENOUS | Status: DC | PRN
  Administered 2019-06-28: 170 mg via INTRAVENOUS

## 2019-06-28 SURGICAL SUPPLY — 42 items
CANISTER SUCT 1200ML W/VALVE (MISCELLANEOUS) ×1 IMPLANT
CANISTER SUCT 3000ML PPV (MISCELLANEOUS) ×2 IMPLANT
CHLORAPREP W/TINT 26 (MISCELLANEOUS) ×3 IMPLANT
CLIP VESOLOCK MED LG 6/CT (CLIP) ×5 IMPLANT
COVER TIP SHEARS 8 DVNC (MISCELLANEOUS) ×1 IMPLANT
COVER TIP SHEARS 8MM DA VINCI (MISCELLANEOUS) ×4
COVER WAND RF STERILE (DRAPES) ×1 IMPLANT
DECANTER SPIKE VIAL GLASS SM (MISCELLANEOUS) ×3 IMPLANT
DEFOGGER SCOPE WARMER CLEARIFY (MISCELLANEOUS) ×3 IMPLANT
DERMABOND ADVANCED (GAUZE/BANDAGES/DRESSINGS) ×2
DERMABOND ADVANCED .7 DNX12 (GAUZE/BANDAGES/DRESSINGS) ×1 IMPLANT
DRAPE ARM DVNC X/XI (DISPOSABLE) ×4 IMPLANT
DRAPE COLUMN DVNC XI (DISPOSABLE) ×1 IMPLANT
DRAPE DA VINCI XI ARM (DISPOSABLE) ×8
DRAPE DA VINCI XI COLUMN (DISPOSABLE) ×2
GLOVE BIO SURGEON STRL SZ7 (GLOVE) ×2 IMPLANT
GLOVE BIOGEL PI IND STRL 7.5 (GLOVE) IMPLANT
GLOVE BIOGEL PI INDICATOR 7.5 (GLOVE) ×2
GLOVE ORTHO TXT STRL SZ7.5 (GLOVE) ×6 IMPLANT
GOWN STRL REUS W/ TWL LRG LVL3 (GOWN DISPOSABLE) ×4 IMPLANT
GOWN STRL REUS W/TWL LRG LVL3 (GOWN DISPOSABLE) ×8
GRASPER SUT TROCAR 14GX15 (MISCELLANEOUS) IMPLANT
IRRIGATION STRYKERFLOW (MISCELLANEOUS) IMPLANT
IRRIGATOR STRYKERFLOW (MISCELLANEOUS) ×3
IV NS IRRIG 3000ML ARTHROMATIC (IV SOLUTION) ×2 IMPLANT
KIT PINK PAD W/HEAD ARE REST (MISCELLANEOUS) ×3
KIT PINK PAD W/HEAD ARM REST (MISCELLANEOUS) ×1 IMPLANT
KIT TURNOVER KIT A (KITS) ×3 IMPLANT
LABEL OR SOLS (LABEL) ×3 IMPLANT
NDL INSUFFLATION 14GA 120MM (NEEDLE) IMPLANT
NEEDLE HYPO 22GX1.5 SAFETY (NEEDLE) ×3 IMPLANT
NEEDLE INSUFFLATION 14GA 120MM (NEEDLE) ×3 IMPLANT
NS IRRIG 500ML POUR BTL (IV SOLUTION) ×3 IMPLANT
PACK LAP CHOLECYSTECTOMY (MISCELLANEOUS) ×3 IMPLANT
POUCH SPECIMEN RETRIEVAL 10MM (ENDOMECHANICALS) ×3 IMPLANT
SEAL CANN UNIV 5-8 DVNC XI (MISCELLANEOUS) ×4 IMPLANT
SEAL XI 5MM-8MM UNIVERSAL (MISCELLANEOUS) ×8
SET TUBE SMOKE EVAC HIGH FLOW (TUBING) ×3 IMPLANT
SOLUTION ELECTROLUBE (MISCELLANEOUS) ×3 IMPLANT
SUT MNCRL AB 4-0 PS2 18 (SUTURE) ×3 IMPLANT
SUT VICRYL 0 AB UR-6 (SUTURE) ×3 IMPLANT
TROCAR Z-THREAD FIOS 11X100 BL (TROCAR) ×3 IMPLANT

## 2019-06-28 NOTE — Op Note (Signed)
Robotic cholecystectomy  Pre-operative Diagnosis: Acute calculus cholecystitis  Post-operative Diagnosis: Same  Procedure: Robotic assisted laparoscopic cholecystectomy.  Surgeon: Ronny Bacon, M.D., FACS  Anesthesia: General. with endotracheal tube  Findings: Markedly thickened/encasement of infundibulum with retroperitoneal structures, marked surrounding soft tissue edema.  No ICG firefly ever reached gallbladder or cystic duct adjacent to gallbladder.  Estimated Blood Loss: 25 mL         Drains: None         Specimens: Gallbladder           Complications: none   Procedure Details  The patient was seen again in the Holding Room.  1.25 mg dose of ICG was administered intravenously.  The benefits, complications, treatment options, risks and expected outcomes were discussed with the patient. The likelihood of improving the patient's symptoms with return to their baseline status is good.  The patient and/or family concurred with the proposed plan, giving informed consent, again alternatives reviewed.  The patient was taken to Operating Room, identified, and the procedure verified as robotic assisted laparoscopic cholecystectomy.   Prior to the induction of general anesthesia, antibiotic prophylaxis was administered. VTE prophylaxis was in place. General endotracheal anesthesia was then administered and tolerated well. The patient was positioned in the supine position.  After the induction, the abdomen was prepped with Chloraprep and draped in the sterile fashion.    A Time Out was held and the above information confirmed.  After local infiltration of quarter percent Marcaine with epinephrine, stab incision was made left upper quadrant.  Just below the costal margin approximately midclavicular line the Veres needle is passed with sensation of the layers to penetrate the abdominal wall and into the peritoneum.  Saline drop test is confirmed peritoneal placement.  Insufflation is  initiated with carbon dioxide to pressures of 15 mmHg.  Right infra-umbilical local infiltration with quarter percent Marcaine with epinephrine is utilized.  Made a 12 mm incision on the left infra-umbilical site, I advanced an optical 80mm port under direct visualization into the peritoneal cavity.  Once the peritoneum was penetrated, insufflation was transferred.  The trocar was then advanced into the abdominal cavity under direct visualization. Pneumoperitoneum was then continued with CO2 at 14 mmHg or less and tolerated well without any adverse changes in the patient's vital signs.  Two 8.5-mm ports were placed in the left lower quadrant and laterally, and one to the right lower quadrant, all under direct vision. All skin incisions  were infiltrated with a local anesthetic agent before making the incision and placing the trocars.   The patient was positioned  in reverse Trendelenburg, tilted the patient's left side down.  Da Vinci XI robot was then positioned on to the patient's left side, and docked.  The gallbladder was identified, the fundus grasped via the arm 4 Prograsp and retracted cephalad. Adhesions were lysed with meticulous dissection utilizing scissors and cautery.  The tissues encasing the infundibulum were remarkably edematous.  The artery was encountered first.  The infundibulum was identified grasped and retracted laterally, exposing the peritoneum overlying the triangle of Calot. This was then opened and dissected using cautery & scissors. An extended critical view of the cystic duct and cystic artery was obtained. 2 The cystic duct was clearly identified and dissected to isolation.   Artery well isolated and clipped, and the cystic duct was triple clipped and divided.  The gallbladder was taken from the gallbladder fossa in a retrograde fashion with the electrocautery. The gallbladder was removed and placed in  an Endocatch bag.  The liver bed was irrigated and inspected. Hemostasis  was achieved with the electrocautery.  The robot was undocked and moved away from the operative field. Copious irrigation was utilized and was repeatedly aspirated until clear.  The gallbladder and Endocatch sac were then removed through the infraumbilical port site.   Inspection of the right upper quadrant was performed. No bleeding, bile duct injury or leak, or bowel injury was noted. The infra-umbilical port site fascia was closed with interrumpted 0 Vicryl sutures using PMI/cone under direct visualization. Pneumoperitoneum was released and ports removed.  4-0 subcuticular Monocryl was used to close the skin. Dermabond was  applied.  The patient was then extubated and brought to the recovery room in stable condition. Sponge, lap, and needle counts were correct at closure and at the conclusion of the case.

## 2019-06-28 NOTE — Progress Notes (Signed)
Patient c/o 6/10 pain rating; no orders in for pain, IVFs or diet; AC and MD notified; Dr. Claudine Mouton stated that patient was to be discharged; Patient's father, Susy Frizzle already has her pain medication RX filled. VSS, voided well, guarded on moving; Patient and Dad ok with patient being discharged; Patient and Dad voiced understanding of all discharge instructions. Patient to make f/u appt. In am; Down to ED for discharge via w/c with A. Anthonette Legato, NT. Windy Carina, RN 11:49 PM 06/28/2019

## 2019-06-28 NOTE — Anesthesia Procedure Notes (Signed)
Procedure Name: Intubation Date/Time: 06/28/2019 7:40 PM Performed by: Waldo Laine, CRNA Pre-anesthesia Checklist: Suction available Patient Re-evaluated:Patient Re-evaluated prior to induction Oxygen Delivery Method: Circle system utilized Preoxygenation: Pre-oxygenation with 100% oxygen Induction Type: IV induction Ventilation: Mask ventilation without difficulty Laryngoscope Size: 3 and McGraph Grade View: Grade I Tube type: Oral Tube size: 7.0 mm Number of attempts: 1 Airway Equipment and Method: Stylet Placement Confirmation: ETT inserted through vocal cords under direct vision,  positive ETCO2 and breath sounds checked- equal and bilateral Secured at: 20 cm Tube secured with: Tape Dental Injury: Teeth and Oropharynx as per pre-operative assessment

## 2019-06-28 NOTE — Anesthesia Postprocedure Evaluation (Signed)
Anesthesia Post Note  Patient: Catera Hankins  Procedure(s) Performed: XI ROBOTIC ASSISTED LAPAROSCOPIC CHOLECYSTECTOMY (N/A Abdomen)  Patient location during evaluation: PACU Anesthesia Type: General Level of consciousness: awake and alert Pain management: pain level controlled Vital Signs Assessment: post-procedure vital signs reviewed and stable Respiratory status: spontaneous breathing, nonlabored ventilation, respiratory function stable and patient connected to nasal cannula oxygen Cardiovascular status: blood pressure returned to baseline and stable Postop Assessment: no apparent nausea or vomiting Anesthetic complications: no     Last Vitals:  Vitals:   06/28/19 2247 06/28/19 2317  BP: 103/67 131/85  Pulse: 88 73  Resp: 18   Temp: 37.1 C 36.9 C  SpO2: 98% 98%    Last Pain:  Vitals:   06/28/19 2317  TempSrc: Oral  PainSc: 6                  Lenard Simmer

## 2019-06-28 NOTE — Anesthesia Preprocedure Evaluation (Signed)
Anesthesia Evaluation  Patient identified by MRN, date of birth, ID band Patient awake    Reviewed: Allergy & Precautions, H&P , NPO status , Patient's Chart, lab work & pertinent test results, reviewed documented beta blocker date and time   History of Anesthesia Complications Negative for: history of anesthetic complications  Airway Mallampati: III  TM Distance: >3 FB Neck ROM: full    Dental  (+) Dental Advidsory Given, Teeth Intact   Pulmonary neg pulmonary ROS, neg shortness of breath, neg COPD, neg recent URI, Patient abstained from smoking., former smoker,    Pulmonary exam normal breath sounds clear to auscultation       Cardiovascular Exercise Tolerance: Good negative cardio ROS Normal cardiovascular exam Rhythm:regular Rate:Normal     Neuro/Psych negative neurological ROS  negative psych ROS   GI/Hepatic negative GI ROS, Neg liver ROS,   Endo/Other  negative endocrine ROS  Renal/GU negative Renal ROS  negative genitourinary   Musculoskeletal   Abdominal   Peds  Hematology negative hematology ROS (+)   Anesthesia Other Findings Past Medical History: No date: Known health problems: none   Reproductive/Obstetrics negative OB ROS                             Anesthesia Physical Anesthesia Plan  ASA: I  Anesthesia Plan: General   Post-op Pain Management:    Induction: Intravenous  PONV Risk Score and Plan: 3 and Dexamethasone, Ondansetron, Treatment may vary due to age or medical condition, Midazolam and Promethazine  Airway Management Planned: Oral ETT  Additional Equipment:   Intra-op Plan:   Post-operative Plan: Extubation in OR  Informed Consent: I have reviewed the patients History and Physical, chart, labs and discussed the procedure including the risks, benefits and alternatives for the proposed anesthesia with the patient or authorized representative who has  indicated his/her understanding and acceptance.     Dental Advisory Given  Plan Discussed with: Anesthesiologist, CRNA and Surgeon  Anesthesia Plan Comments:         Anesthesia Quick Evaluation

## 2019-06-28 NOTE — H&P (Signed)
[facility] SURGICAL HISTORY & PHYSICAL (cpt [phone])  HISTORY OF PRESENT ILLNESS (HPI):  29 y.o. female presented to [facility] ED yesterday (06/27/2019) for abdominal pain. Patient reports she noticed the acute onset of RUQ abdominal pain yesterday morning when she woke up. The pain was described as a sharp and aching pain. This persisted throughout the morning without any relief. She has associated nausea and emesis with the pain. No fever, chills, cough, SOB, CP, juandice, or urinary or bowel changes. No previous history of the same. She did report last night was a spicy, greasy meal. No previous abdominal surgeries. Work up in the ED was concerning for mild leukocytosis and cholelithiasis with equivocal findings for cholecystitis on [place].  General surgery was consulted by emergency medicine physician Dr Minna Antis, MD for evaluation and management of symptomatic cholelithiasis    PAST MEDICAL HISTORY (PMH):  Past Medical History:  Diagnosis Date  . Known health problems: none     Reviewed. Otherwise negative.   PAST SURGICAL HISTORY (PSH):  Past Surgical History:  Procedure Laterality Date  . NO PAST SURGERIES      Reviewed. Otherwise negative.   MEDICATIONS:  Prior to Admission medications   Medication Sig Start Date End Date Taking? Authorizing Provider  B Complex-C (B-COMPLEX WITH VITAMIN C) tablet Take 1 tablet by mouth daily.   Yes [provider]     ALLERGIES:  Allergies  Allergen Reactions  . Penicillins Anaphylaxis  . Sulfa Antibiotics Anaphylaxis     SOCIAL HISTORY:  Social History   Socioeconomic History  . Marital status: Single    Spouse name: Not on file  . Number of children: Not on file  . Years of education: Not on file  . Highest education level: Not on file  Occupational History  . Not on file  Tobacco Use  . Smoking status: Former Smoker    Packs/day: 0.25  . Smokeless tobacco: Never Used  . Tobacco comment: quit ~  12/24/2018  Substance and Sexual Activity  . Alcohol use: Not Currently    Alcohol/week: 1.0 standard drinks    Types: 1 Glasses of wine per week    Comment: 8  . Drug use: No  . Sexual activity: Yes    Birth control/protection: None  Other Topics Concern  . Not on file  Social History Narrative  . Not on file   Social Determinants of Health   Financial Resource Strain:   . Difficulty of Paying Living Expenses:   Food Insecurity:   . Worried About Programme researcher, broadcasting/film/video in the Last Year:   . Barista in the Last Year:   Transportation Needs:   . Freight forwarder (Medical):   Marland Kitchen Lack of Transportation (Non-Medical):   Physical Activity:   . Days of Exercise per Week:   . Minutes of Exercise per Session:   Stress:   . Feeling of Stress :   Social Connections:   . Frequency of Communication with Friends and Family:   . Frequency of Social Gatherings with Friends and Family:   . Attends Religious Services:   . Active Member of Clubs or Organizations:   . Attends Banker Meetings:   Marland Kitchen Marital Status:   Intimate Partner Violence: Not At Risk  . Fear of Current or Ex-Partner: No  . Emotionally Abused: No  . Physically Abused: No  . Sexually Abused: No     FAMILY HISTORY:  Family History  Problem Relation Age  of Onset  . AAA (abdominal aortic aneurysm) Maternal Grandmother   . AAA (abdominal aortic aneurysm) Maternal Grandfather     Otherwise negative.   REVIEW OF SYSTEMS:  Review of Systems  Constitutional: Negative for chills and fever.  HENT: Negative for congestion and sore throat.   Respiratory: Negative for cough and shortness of breath.   Cardiovascular: Negative for chest pain and palpitations.  Gastrointestinal: Positive for abdominal pain, nausea and vomiting. Negative for blood in stool, constipation and diarrhea.  Genitourinary: Negative for dysuria and urgency.  All other systems reviewed and are negative.   VITAL SIGNS:  Temp:   [97.2 F (36.2 C)-99.1 F (37.3 C)] 98.6 F (37 C) (05/10 0531) Pulse Rate:  [46-83] 74 (05/10 0531) Resp:  [12-24] 14 (05/10 0531) BP: (107-143)/(46-101) 113/69 (05/10 0531) SpO2:  [94 %-100 %] 100 % (05/10 0531) Weight:  [77.6 kg] 77.6 kg (05/09 0950)     Height: 5\' 4"  (162.6 cm) Weight: 77.6 kg BMI (Calculated): 29.34   PHYSICAL EXAM:  Physical Exam Vitals and nursing note reviewed.  Constitutional:      General: She is not in acute distress.    Appearance: She is well-developed and normal weight. She is not ill-appearing.  HENT:     Head: Normocephalic and atraumatic.  Eyes:     General: No scleral icterus.    Extraocular Movements: Extraocular movements intact.  Cardiovascular:     Rate and Rhythm: Normal rate and regular rhythm.     Heart sounds: Normal heart sounds. No murmur. No friction rub. No gallop.   Pulmonary:     Effort: Pulmonary effort is normal. No respiratory distress.     Breath sounds: Normal breath sounds.  Abdominal:     General: Abdomen is flat. There is no distension.     Palpations: Abdomen is soft.     Tenderness: There is abdominal tenderness in the right upper quadrant and epigastric area. There is no guarding or rebound. Negative signs include Murphy's sign.  Genitourinary:    Comments: Deferred Skin:    General: Skin is warm and dry.     Coloration: Skin is not jaundiced or pale.  Neurological:     General: No focal deficit present.     Mental Status: She is alert and oriented to person, place, and time.  Psychiatric:        Mood and Affect: Mood normal.        Behavior: Behavior normal.     INTAKE/OUTPUT:  This shift: No intake/output data recorded.  Last 2 shifts: @IOLAST2SHIFTS @  Labs:  CBC Latest Ref Rng & Units 06/27/2019 01/12/2019 11/01/2018  WBC 4.0 - 10.5 K/uL 13.3(H) 11.3(H) 7.0  Hemoglobin 12.0 - 15.0 g/dL 16.3(H) 15.0 13.9  Hematocrit 36.0 - 46.0 % 46.0 44.6 41.6  Platelets 150 - 400 K/uL 221 211 198   CMP Latest Ref Rng  & Units 06/27/2019 01/12/2019 11/01/2018  Glucose 70 - 99 mg/dL 148(H) 104(H) 90  BUN 6 - 20 mg/dL 14 12 9   Creatinine 0.44 - 1.00 mg/dL 0.78 0.72 0.72  Sodium 135 - 145 mmol/L 136 136 141  Potassium 3.5 - 5.1 mmol/L 3.9 3.9 4.0  Chloride 98 - 111 mmol/L 104 102 108  CO2 22 - 32 mmol/L 21(L) 25 23  Calcium 8.9 - 10.3 mg/dL 9.2 8.8(L) 8.6(L)  Total Protein 6.5 - 8.1 g/dL 7.2 - 6.9  Total Bilirubin 0.3 - 1.2 mg/dL 0.7 - 0.3  Alkaline Phos 38 - 126 U/L 65 -  52  AST 15 - 41 U/L 26 - 27  ALT 0 - 44 U/L 31 - 19     Imaging studies:   CT Abdomen/Pelvis (06/27/2019) personally reviewed with tiny air filled gallstones but no definitive gallbladder wall thickening or pericholecystic fluid, and radiologist report reviewed:  IMPRESSION: 1. 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 There is mild hydronephrosis involving the left cross fused ectopic moiety. No renal or ureteral calculus is appreciable. Urinary bladder wall thickness is normal. Question recent calculus passage on the left. Early pyelonephritis involving the left renal moiety could present in this manner. No evidence suggesting renal abscess. No perinephric fluid evident.  2. Areas of ill-defined airspace opacity in the right lower lobe. Question atypical organism pneumonia. Check of COVID-19 status advised in this regard.  3.  Tiny nitrogen containing gallstones noted.  4. No bowel obstruction. No abscess in the abdomen or pelvis. Appendix appears normal.   RUQ [place] (06/27/2019) personally reviewed which shows definitive cholelithiasis and mild GB wall thickening, and radiologist report reviewed:  IMPRESSION: Findings are equivocal for acute cholecystitis. While there are gallstones with gallbladder wall thickening, the sonographic Eulah Pont sign is reported as negative. Consider further evaluation with HIDA scan as clinically indicated.   Assessment/Plan: (ICD-10's: K26.20) 29 y.o. female with  likely symptomatic cholelithiasis without significant evidence of cholecystitis, possibly early.   - Admit to general surgery  - Plan for robotic assisted laparoscopic cholecystectomy today with Dr Claudine Mouton pending OR/Anesthesia availability    - All risks, benefits, and alternatives to above procedure(s) were discussed with the patient, all of her questions were answered to her expressed satisfaction, patient expresses she wishes to proceed, and informed consent was obtained.  - NPO  - IVF Resuscitation   - continue ABx perioperatively  - pain control prn; antiemetics prn  - monitor abdominal examination  - trend leukocytosis  - DVT prophylaxis; hold for OR  All of the above findings and recommendations were discussed with the patient and her family at bediside, and all of their questions were answered to their expressed satisfaction.  -- Lynden Oxford, PA-C [facility] 06/28/2019, 9:30 AM [phone] M-F: 7am - 4pm

## 2019-06-28 NOTE — Transfer of Care (Signed)
Immediate Anesthesia Transfer of Care Note  Patient: Suzanne Davies  Procedure(s) Performed: XI ROBOTIC ASSISTED LAPAROSCOPIC CHOLECYSTECTOMY (N/A Abdomen)  Patient Location: PACU  Anesthesia Type:General  Level of Consciousness: awake  Airway & Oxygen Therapy: Patient Spontanous Breathing and Patient connected to face mask oxygen  Post-op Assessment: Report given to RN and Post -op Vital signs reviewed and stable  Post vital signs: Reviewed and stable  Last Vitals:  Vitals Value Taken Time  BP 110/74 06/28/19 2134  Temp    Pulse 90 06/28/19 2134  Resp    SpO2 100 % 06/28/19 2134  Vitals shown include unvalidated device data.  Last Pain:  Vitals:   06/28/19 1549  TempSrc: Temporal  PainSc: 3       Patients Stated Pain Goal: 0 (06/28/19 0030)  Complications: No apparent anesthesia complications

## 2019-06-30 LAB — SURGICAL PATHOLOGY

## 2019-07-01 NOTE — Discharge Summary (Signed)
Patient ID: Suzanne Davies MRN: [id number] DOB/AGE: Apr 01, 1990 29 y.o.  Admit date: 06/27/2019 Discharge date: 06/28/2019   Discharge Diagnoses:  Active Problems:   Acute cholecystitis due to biliary calculus   Procedures: Robotic cholecystectomy.   Hospital Course:   Admitted with findings consistent with acute calculus cholecystitis and  was taken to the operating room for an uneventful robotic cholecystectomy.  The time of discharge, as requested per family on the same day of surgery, the patient was ambulating,  pain was controlled.  Her vital signs were stable and she was afebrile.   Physical exam at discharge showed a pt  in no acute distress.  Awake and alert.  Abdomen: Soft/incisions intact.  Extremities well-perfused and no edema.  Condition of the patient the time of discharge was stable   Consults:   Disposition: Discharge disposition: 01-Home or Self Care       Discharge Instructions    Diet - low sodium heart healthy   Complete by: As directed    Discharge wound care:   Complete by: As directed    Your incision was closed with Dermabond.  It is best to keep it clean and dry, it will tolerate a brief shower, but do not soak it or apply any creams or lotions to the incisions.  The Dermabond should gradually flake off over time.  Keep it open to air so you can evaluate your incisions.  Dermabond assists the underlying sutures to keep your incision closed and protected from infection.  Should you develop some drainage from your incision, some drops of drainage would be okay but if it persists continue to put keep a dry dressing over it.   Driving Restrictions   Complete by: As directed    No driving for 1 week.   Increase activity slowly   Complete by: As directed    Lifting restrictions   Complete by: As directed    No lifting greater than 15 pounds for the next 4 weeks.     Allergies as of 06/28/2019      Reactions   Penicillins Anaphylaxis   Sulfa Antibiotics  Anaphylaxis      Medication List    TAKE these medications   B-complex with vitamin C tablet Take 1 tablet by mouth daily. Notes to patient: Resume home regimen.   HYDROcodone-acetaminophen 5-325 MG tablet Commonly known as: Norco Take 2 tablets by mouth every 6 (six) hours as needed for moderate pain.   ibuprofen 800 MG tablet Commonly known as: ADVIL Take 1 tablet (800 mg total) by mouth every 8 (eight) hours as needed.            Discharge Care Instructions  (From admission, onward)         Start     Ordered   06/28/19 0000  Discharge wound care:    Comments: Your incision was closed with Dermabond.  It is best to keep it clean and dry, it will tolerate a brief shower, but do not soak it or apply any creams or lotions to the incisions.  The Dermabond should gradually flake off over time.  Keep it open to air so you can evaluate your incisions.  Dermabond assists the underlying sutures to keep your incision closed and protected from infection.  Should you develop some drainage from your incision, some drops of drainage would be okay but if it persists continue to put keep a dry dressing over it.   06/28/19 2119  Follow-up Information    Campbell Lerner, MD In 2 weeks.   Specialty: General Surgery Contact information: [address] [place] [address] [phone]

## 2019-11-10 ENCOUNTER — Other Ambulatory Visit: Payer: Self-pay

## 2019-11-10 ENCOUNTER — Emergency Department
Admission: EM | Admit: 2019-11-10 | Discharge: 2019-11-11 | Disposition: A | Discharge status: Home / Self Care | Payer: Self-pay | Attending: Emergency Medicine | Admitting: Emergency Medicine

## 2019-11-10 DIAGNOSIS — T401X1A Poisoning by heroin, accidental (unintentional), initial encounter: Secondary | ICD-10-CM | POA: Insufficient documentation

## 2019-11-10 DIAGNOSIS — T40601A Poisoning by unspecified narcotics, accidental (unintentional), initial encounter: Secondary | ICD-10-CM

## 2019-11-10 DIAGNOSIS — Z87891 Personal history of nicotine dependence: Secondary | ICD-10-CM | POA: Insufficient documentation

## 2019-11-10 DIAGNOSIS — F321 Major depressive disorder, single episode, moderate: Secondary | ICD-10-CM | POA: Insufficient documentation

## 2019-11-10 MED ORDER — SODIUM CHLORIDE 0.9 % IV BOLUS
1000.0000 mL | Freq: Once | INTRAVENOUS | Status: AC
  Administered 2019-11-11: 1000 mL via INTRAVENOUS

## 2019-11-10 NOTE — ED Triage Notes (Signed)
Pt found down in bathroom, reportedly overdose; pt reports she snorted some kind of pain pill that was blue, has taken adderrall earlier today; pt was given Narcan en route and was reversed. Is awake and conversant during triage

## 2019-11-10 NOTE — ED Provider Notes (Addendum)
[facility] Emergency Department Provider Note  ____________________________________________   First MD Initiated Contact with Patient 11/10/19 2331     (approximate)  I have reviewed the triage vital signs and the nursing notes.   HISTORY  Chief Complaint Drug Overdose  HPI Suzanne Davies is a 29 y.o. female with below list of previous medical conditions, and polysubstance abuse presents to the emergency department EMS after "snorting a blue pill that said M30" earlier this evening.  Patient was given Narcan via EMS in route with return of consciousness and improvement of oxygenation.  Patient states that she does not know what the pill was.  She does admit to taking her all orally as well as using cocaine 2 days ago.       Past Medical History:  Diagnosis Date  . Known health problems: none     Patient Active Problem List   Diagnosis Date Noted  . Acute cholecystitis due to biliary calculus 06/27/2019  . Polysubstance abuse (HCC) 11/01/2018    Past Surgical History:  Procedure Laterality Date  . NO PAST SURGERIES      Prior to Admission medications   Medication Sig Start Date End Date Taking? Authorizing Provider  B Complex-C (B-COMPLEX WITH VITAMIN C) tablet Take 1 tablet by mouth daily.    [provider]  HYDROcodone-acetaminophen (NORCO) 5-325 MG tablet Take 2 tablets by mouth every 6 (six) hours as needed for moderate pain. 06/28/19   Campbell Lerner, MD  ibuprofen (ADVIL) 800 MG tablet Take 1 tablet (800 mg total) by mouth every 8 (eight) hours as needed. 06/28/19   Campbell Lerner, MD    Allergies Penicillins and Sulfa antibiotics  Family History  Problem Relation Age of Onset  . AAA (abdominal aortic aneurysm) Maternal Grandmother   . AAA (abdominal aortic aneurysm) Maternal Grandfather     Social History Social History   Tobacco Use  . Smoking status: Former Smoker    Packs/day: 0.25  . Smokeless tobacco: Never Used    . Tobacco comment: quit ~ 12/24/2018  Vaping Use  . Vaping Use: Never used  Substance Use Topics  . Alcohol use: Not Currently    Alcohol/week: 1.0 standard drink    Types: 1 Glasses of wine per week    Comment: 8  . Drug use: No    Review of Systems Constitutional: No fever/chills Eyes: No visual changes. ENT: No sore throat. Cardiovascular: Denies chest pain. Respiratory: Denies shortness of breath. Gastrointestinal: No abdominal pain.  No nausea, no vomiting.  No diarrhea.  No constipation. Genitourinary: Negative for dysuria. Musculoskeletal: Negative for neck pain.  Negative for back pain. Integumentary: Negative for rash. Neurological: Negative for headaches, focal weakness or numbness. Psychiatric:  Positive for polysubstance abuse  ____________________________________________   PHYSICAL EXAM:  VITAL SIGNS: ED Triage Vitals  Enc Vitals Group     BP 11/10/19 2239 124/86     Pulse Rate 11/10/19 2239 (!) 131     Resp 11/10/19 2239 (!) 21     Temp 11/10/19 2239 98.1 F (36.7 C)     Temp Source 11/10/19 2239 Oral     SpO2 11/10/19 2239 100 %     Weight 11/10/19 2241 77.1 kg (170 lb)     Height 11/10/19 2241 1.651 m (5\' 5" )     Head Circumference --      Peak Flow --      Pain Score 11/10/19 2246 0     Pain Loc --  Pain Edu? --      Excl. in GC? --     Constitutional: Alert and oriented.  Eyes: Conjunctivae are normal.  Head: Atraumatic. Mouth/Throat: Patient is wearing a mask. Neck: No stridor.  No meningeal signs.   Cardiovascular: Normal rate, regular rhythm. Good peripheral circulation. Grossly normal heart sounds. Respiratory: Normal respiratory effort.  No retractions. Gastrointestinal: Soft and nontender. No distention.  Musculoskeletal: No lower extremity tenderness nor edema. No gross deformities of extremities. Neurologic:  Normal speech and language. No gross focal neurologic deficits are appreciated.  Skin:  Skin is warm, dry and  intact. Psychiatric: Mood and affect are normal. Speech and behavior are normal.    ____________________________________________   INITIAL IMPRESSION / MDM / ASSESSMENT AND PLAN / ED COURSE  As part of my medical decision making, I reviewed the following data within the electronic MEDICAL RECORD NUMBER  A 78-year-old female presented with above-stated history and physical exam consistent with opiate overdose that was reversed in route to the emergency department with Narcan.  Patient remained hemodynamically stable during ED stay.  Patient's oxygen saturation maintained at 98 to 100%.   Patient was offered detox which she refused.     ____________________________________________  FINAL CLINICAL IMPRESSION(S) / ED DIAGNOSES  Final diagnoses:  Opiate overdose, accidental or unintentional, initial encounter (HCC)     MEDICATIONS GIVEN DURING THIS VISIT:  Medications - No data to display   ED Discharge Orders    None      *Please note:  [name] was evaluated in Emergency Department on 11/10/2019 for the symptoms described in the history of present illness. She was evaluated in the context of the global COVID-19 pandemic, which necessitated consideration that the patient might be at risk for infection with the SARS-CoV-2 virus that causes COVID-19. Institutional protocols and algorithms that pertain to the evaluation of patients at risk for COVID-19 are in a state of rapid change based on information released by regulatory bodies including the CDC and federal and state organizations. These policies and algorithms were followed during the patient's care in the ED.  Some ED evaluations and interventions may be delayed as a result of limited staffing during and after the pandemic.*  Note:  This document was prepared using Dragon voice recognition software and may include unintentional dictation errors.   Darci Current, MD 11/11/19 4401    Darci Current, MD 11/11/19 0272     Darci Current, MD 11/11/19 [phone]

## 2019-11-11 LAB — CBC
HCT: 38.5 % (ref 36.0–46.0)
Hemoglobin: 12.7 g/dL (ref 12.0–15.0)
MCH: 30.9 pg (ref 26.0–34.0)
MCHC: 33 g/dL (ref 30.0–36.0)
MCV: 93.7 fL (ref 80.0–100.0)
Platelets: 243 10*3/uL (ref 150–400)
RBC: 4.11 MIL/uL (ref 3.87–5.11)
RDW: 13.8 % (ref 11.5–15.5)
WBC: 11.2 10*3/uL — ABNORMAL HIGH (ref 4.0–10.5)
nRBC: 0 % (ref 0.0–0.2)

## 2019-11-11 LAB — BASIC METABOLIC PANEL
Anion gap: 8 (ref 5–15)
BUN: 7 mg/dL (ref 6–20)
CO2: 26 mmol/L (ref 22–32)
Calcium: 8.9 mg/dL (ref 8.9–10.3)
Chloride: 102 mmol/L (ref 98–111)
Creatinine, Ser: 0.94 mg/dL (ref 0.44–1.00)
GFR calc Af Amer: >60 mL/min (ref >60)
GFR calc non Af Amer: >60 mL/min (ref >60)
Glucose, Bld: 138 mg/dL — ABNORMAL HIGH (ref 70–99)
Potassium: 3.5 mmol/L (ref 3.5–5.1)
Sodium: 136 mmol/L (ref 135–145)

## 2019-11-11 LAB — CK: Total CK: 449 U/L — ABNORMAL HIGH (ref 38–234)

## 2019-11-11 LAB — ETHANOL: Alcohol, Ethyl (B): <10 mg/dL (ref <10)

## 2019-11-11 NOTE — BH Assessment (Signed)
Writer discussed with the patient about the option for outpatient treatment. Writer provided pt with a list of outpatient resources. Patient was in the agreement with the plan.

## 2019-11-11 NOTE — ED Notes (Addendum)
Pt left agitated with staff demanding to walk home. Pt was informed she needed a safe ride home. Pt st she did not have any money and no one who would come and pick her up. Pt was given a cab voucher. Pt upset after being asked to wait in the lobby for the cab. Pt cussing at staff at this time and unwilling to allow this RN to repeat a set of vitals prior to d/c nor sign for e-signature

## 2019-11-11 NOTE — ED Notes (Addendum)
EDP Manson Passey and this RN at bedside per pt request. Pt is confused at this time and repeating information given verbally by the EDP> Pt was informed by the EDP the purpose of obtaining blood work and following through with any recommened treatment regimens along with the potential risk of rhabdomyolyses. Pt confirmed an understanding of what rhabdomyolysis was post education and agree 'd to decline medical treatment in relation to such (including the risks associated with non treatment) . Pt was offered IV fluids by this RN and EDP Manson Passey to which the pt continued to question the true intentions of the medical staff and the results in which the blood would be told to. Pt hyper fixated on having her "person" here to translate the medical information given to her by this narrator.   Pt was asked how we could be of service to her, pt st she just needs a phone charger so she can leave. EDP Manson Passey explained the potential risks of leaving without additional medical treatment. Pt st "I'm fine, like I don't like how I feel like I'm held hostage. All I was asking is what the fluids would do and you said it would help my heart rate since its like 123. It was 116 though so like I'm fine and really don't need the blood work. I know I don't have an infection and Im healthy cause I'm talking to you. I'm awake, I'm breathing on my own so I just really need to see if my like bf or the guy who was with me is on the way because I need someone here and I'm being held against my will".   Pt is A&Ox4 based on the neuro assessment preformed but has obvious repeattive speech. Pt st she just wants a type C charger and was informed that she has the right to accept/ decline any treatment offered to her.   Pt given a phone to call at this time

## 2019-11-11 NOTE — ED Notes (Signed)
This RN informed pt about the treatment and reiterated the previously discussed plan of care as per EDP Brown/ Pt st she was never told about such and is requesting to go home. Pt was asked to what could be done to help better understand the situation. Pt st there has been a lot of changes and things said that I don't understand and I wanna go home> Pt unable to further explain what "changes" or misunderstood information that has been conveyed. Pt very argumentive and protective at this time stating she needs her "person"
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
## 2019-11-11 NOTE — ED Notes (Signed)
EDP at bedside to discuss pt's elevated CK level. Pt agree 'd to accept IV fluid administration at this time.

## 2020-01-09 ENCOUNTER — Emergency Department: Payer: Self-pay

## 2020-01-09 ENCOUNTER — Emergency Department
Admission: EM | Admit: 2020-01-09 | Discharge: 2020-01-09 | Disposition: A | Discharge status: Home / Self Care | Payer: Self-pay | Attending: Emergency Medicine | Admitting: Emergency Medicine

## 2020-01-09 ENCOUNTER — Other Ambulatory Visit: Payer: Self-pay

## 2020-01-09 ENCOUNTER — Encounter: Payer: Self-pay | Admitting: Radiology

## 2020-01-09 DIAGNOSIS — R930 Abnormal findings on diagnostic imaging of skull and head, not elsewhere classified: Secondary | ICD-10-CM | POA: Insufficient documentation

## 2020-01-09 DIAGNOSIS — Z87891 Personal history of nicotine dependence: Secondary | ICD-10-CM | POA: Insufficient documentation

## 2020-01-09 DIAGNOSIS — F1490 Cocaine use, unspecified, uncomplicated: Secondary | ICD-10-CM | POA: Insufficient documentation

## 2020-01-09 DIAGNOSIS — R079 Chest pain, unspecified: Secondary | ICD-10-CM | POA: Insufficient documentation

## 2020-01-09 LAB — CBC
HCT: 40.7 % (ref 36.0–46.0)
Hemoglobin: 13.4 g/dL (ref 12.0–15.0)
MCH: 28.7 pg (ref 26.0–34.0)
MCHC: 32.9 g/dL (ref 30.0–36.0)
MCV: 87.2 fL (ref 80.0–100.0)
Platelets: 205 10*3/uL (ref 150–400)
RBC: 4.67 MIL/uL (ref 3.87–5.11)
RDW: 15 % (ref 11.5–15.5)
WBC: 6.3 10*3/uL (ref 4.0–10.5)
nRBC: 0 % (ref 0.0–0.2)

## 2020-01-09 LAB — TROPONIN I (HIGH SENSITIVITY)
Troponin I (High Sensitivity): 5 ng/L (ref <18)
Troponin I (High Sensitivity): 5 ng/L (ref <18)

## 2020-01-09 LAB — BASIC METABOLIC PANEL
Anion gap: 11 (ref 5–15)
BUN: 8 mg/dL (ref 6–20)
CO2: 22 mmol/L (ref 22–32)
Calcium: 9.2 mg/dL (ref 8.9–10.3)
Chloride: 104 mmol/L (ref 98–111)
Creatinine, Ser: 0.71 mg/dL (ref 0.44–1.00)
GFR, Estimated: >60 mL/min (ref >60)
Glucose, Bld: 103 mg/dL — ABNORMAL HIGH (ref 70–99)
Potassium: 3.9 mmol/L (ref 3.5–5.1)
Sodium: 137 mmol/L (ref 135–145)

## 2020-01-09 LAB — SALICYLATE LEVEL: Salicylate Lvl: <7 mg/dL — ABNORMAL LOW (ref 7.0–30.0)

## 2020-01-09 LAB — ACETAMINOPHEN LEVEL: Acetaminophen (Tylenol), Serum: <10 ug/mL — ABNORMAL LOW (ref 10–30)

## 2020-01-09 LAB — ETHANOL: Alcohol, Ethyl (B): <10 mg/dL (ref <10)

## 2020-01-09 LAB — HCG, QUANTITATIVE, PREGNANCY: hCG, Beta Chain, Quant, S: <1 m[IU]/mL (ref <5)

## 2020-01-09 MED ORDER — LORAZEPAM 2 MG/ML IJ SOLN
2.0000 mg | Freq: Once | INTRAMUSCULAR | Status: AC
  Administered 2020-01-09: 2 mg via INTRAVENOUS

## 2020-01-09 MED ORDER — SODIUM CHLORIDE 0.9 % IV BOLUS
1000.0000 mL | Freq: Once | INTRAVENOUS | Status: AC
  Administered 2020-01-09: 1000 mL via INTRAVENOUS

## 2020-01-09 MED ORDER — SODIUM CHLORIDE 0.9 % IV BOLUS: 1000.0000 mL | Freq: Once | INTRAVENOUS | Status: DC

## 2020-01-09 MED ORDER — LACTATED RINGERS IV BOLUS: 1000.0000 mL | Freq: Once | INTRAVENOUS | Status: DC

## 2020-01-09 MED ORDER — LORAZEPAM 2 MG/ML IJ SOLN
2.0000 mg | Freq: Once | INTRAMUSCULAR | Status: DC
  Filled 2020-01-09: qty 1

## 2020-01-09 NOTE — ED Notes (Signed)
Patient denies pain and is resting comfortably.  

## 2020-01-09 NOTE — ED Notes (Signed)
Pt refuses to keep cardiac leads, pox and blood pressure cuff on. Explanation provided multiple times to pt regarding improtance of monitoring without compliance.

## 2020-01-09 NOTE — ED Triage Notes (Signed)
Pt presents to ed with left sided chest pain extending to left shoulder and arm for unknown amount of time. Pt is very anxious, appears confused and impaired. Pt states she has had cocaine and etoh pta.

## 2020-01-09 NOTE — ED Notes (Signed)
EDP at bedside  

## 2020-01-09 NOTE — ED Notes (Signed)
Patient left with father.

## 2020-01-09 NOTE — ED Notes (Signed)
Report to reina, rn.  

## 2020-01-09 NOTE — ED Notes (Signed)
Contacted father Oceane Fosse to come pick up patient. Patient provided his information to contact him.

## 2020-01-09 NOTE — ED Notes (Signed)
Pt consents to placement of pox on left hand for monitoring, but refuses all else. Pt is now refusing to answer any questions or communicate further with this RN.

## 2020-01-09 NOTE — ED Notes (Signed)
Pt back from CT

## 2020-01-09 NOTE — ED Notes (Signed)
Pt calmer .

## 2020-01-09 NOTE — ED Provider Notes (Signed)
[facility] Emergency Department Provider Note   ____________________________________________   First MD Initiated Contact with Patient 01/09/20 0700     (approximate)  I have reviewed the triage vital signs and the nursing notes.   HISTORY  Chief Complaint Chest Pain    HPI Hayes Rehfeldt is a 29 y.o. female with past medical history of polysubstance abuse who presents to the ED complaining of chest pain.  History is limited due to intoxication, on arrival patient admitted to alcohol intoxication and cocaine use prior to arrival.  She initially reported left-sided chest pain extending to her left shoulder and arm, appeared confused and anxious in triage.  She refused monitoring at that time and was given IV Ativan and is now somnolent.  She opens eyes to voice but does not answer questions.        Past Medical History:  Diagnosis Date  . Known health problems: none     Patient Active Problem List   Diagnosis Date Noted  . Acute cholecystitis due to biliary calculus 06/27/2019  . Polysubstance abuse (HCC) 11/01/2018    Past Surgical History:  Procedure Laterality Date  . NO PAST SURGERIES      Prior to Admission medications   Medication Sig Start Date End Date Taking? Authorizing Provider  B Complex-C (B-COMPLEX WITH VITAMIN C) tablet Take 1 tablet by mouth daily.    [provider]  HYDROcodone-acetaminophen (NORCO) 5-325 MG tablet Take 2 tablets by mouth every 6 (six) hours as needed for moderate pain. 06/28/19   Campbell Lerner, MD  ibuprofen (ADVIL) 800 MG tablet Take 1 tablet (800 mg total) by mouth every 8 (eight) hours as needed. 06/28/19   Campbell Lerner, MD    Allergies Penicillins and Sulfa antibiotics  Family History  Problem Relation Age of Onset  . AAA (abdominal aortic aneurysm) Maternal Grandmother   . AAA (abdominal aortic aneurysm) Maternal Grandfather     Social History Social History   Tobacco Use  .  Smoking status: Former Smoker    Packs/day: 0.25  . Smokeless tobacco: Never Used  . Tobacco comment: quit ~ 12/24/2018  Vaping Use  . Vaping Use: Never used  Substance Use Topics  . Alcohol use: Not Currently    Alcohol/week: 1.0 standard drink    Types: 1 Glasses of wine per week    Comment: 8  . Drug use: No    Review of Systems Unable to obtain secondary to intoxication  ____________________________________________   PHYSICAL EXAM:  VITAL SIGNS: ED Triage Vitals  Enc Vitals Group     BP 01/09/20 0540 (!) 134/97     Pulse Rate 01/09/20 0540 90     Resp 01/09/20 0540 (!) 22     Temp 01/09/20 0540 99 F (37.2 C)     Temp src --      SpO2 01/09/20 0540 100 %     Weight 01/09/20 0541 156 lb (70.8 kg)     Height 01/09/20 0541 5\' 4"  (1.626 m)     Head Circumference --      Peak Flow --      Pain Score --      Pain Loc --      Pain Edu? --      Excl. in GC? --     Constitutional: Somnolent but arousable to voice. Eyes: Conjunctivae are normal. Head: Atraumatic. Nose: No congestion/rhinnorhea. Mouth/Throat: Mucous membranes are moist. Neck: Normal ROM Cardiovascular: Normal rate, regular rhythm. Grossly normal heart  sounds. Respiratory: Normal respiratory effort.  No retractions. Lungs CTAB. Gastrointestinal: Soft and nontender. No distention. Genitourinary: deferred Musculoskeletal: No lower extremity tenderness nor edema. Neurologic: No verbal response.  Opens eyes to voice, no gross focal neurologic deficits are appreciated, moves all extremities purposefully. Skin:  Skin is warm, dry and intact. No rash noted. Psychiatric: Mood and affect are normal. Speech and behavior are normal.  ____________________________________________   LABS (all labs ordered are listed, but only abnormal results are displayed)  Labs Reviewed  BASIC METABOLIC PANEL - Abnormal; Notable for the following components:      Result Value   Glucose, Bld 103 (*)    All other components  within normal limits  ACETAMINOPHEN LEVEL - Abnormal; Notable for the following components:   Acetaminophen (Tylenol), Serum <10 (*)    All other components within normal limits  SALICYLATE LEVEL - Abnormal; Notable for the following components:   Salicylate Lvl <7.0 (*)    All other components within normal limits  CBC  ETHANOL  HCG, QUANTITATIVE, PREGNANCY  URINE DRUG SCREEN, QUALITATIVE ([facility] ONLY)  POC URINE PREG, ED  TROPONIN I (HIGH SENSITIVITY)  TROPONIN I (HIGH SENSITIVITY)   ____________________________________________  EKG  ED ECG REPORT I, Chesley Noon, the attending physician, personally viewed and interpreted this ECG.   Date: 01/09/2020  EKG Time: 5:37  Rate: 102  Rhythm: sinus tachycardia  Axis: Normal  Intervals:none  ST&T Change: None   PROCEDURES  Procedure(s) performed (including Critical Care):  Procedures   ____________________________________________   INITIAL IMPRESSION / ASSESSMENT AND PLAN / ED COURSE       29 year old female with past medical history of polysubstance abuse who presents to the ED initially for chest pain radiating to her left arm in the setting of cocaine use and alcohol intoxication.  Patient was given IV Ativan and is now somnolent but arousable to voice with no focal neurologic deficits.  EKG shows no evidence of arrhythmia or ischemia and initial troponin is negative.  Patient initially refused 2 view chest x-ray but we will perform portable chest x-ray instead.  Remainder of labs are thus far unremarkable, we will add on ethanol level, Tylenol level, and salicylate level.  Plan to hydrate patient with IV fluids, repeat troponin, and observe until clinically sober if work-up unremarkable.  Blood alcohol is negative, Tylenol and salicylate levels are also undetectable.  Given patient remains significantly somnolent, CT head was performed, images reviewed by me and showed no obvious hemorrhage, negative for acute process per  radiology.  We will observe patient until she is clinically sober.  ----------------------------------------- 2:40 PM on 01/09/2020 -----------------------------------------  Patient is now easily arousable, denies any ongoing chest pain and 2 sets of troponin have been negative.  We will have patient eat something and her father was called to give her a ride home.  Patient counseled to avoid cocaine use and to establish care with PCP, otherwise return to the ED for new worsening symptoms.  Patient agrees with plan.      ____________________________________________   FINAL CLINICAL IMPRESSION(S) / ED DIAGNOSES  Final diagnoses:  Nonspecific chest pain  Cocaine use     ED Discharge Orders    None       Note:  This document was prepared using Dragon voice recognition software and may include unintentional dictation errors.   Chesley Noon, MD 01/09/20 1446

## 2020-01-09 NOTE — ED Notes (Signed)
Pt taken to CT.

## 2020-02-19 DEATH — deceased

## 2020-11-19 IMAGING — US US ABDOMEN LIMITED
1 series · 14 of 25 positions shown · non-contrast
Comparison: CT from the same day

CLINICAL DATA: Pain

EXAM:
ULTRASOUND ABDOMEN LIMITED RIGHT UPPER QUADRANT

[Series 1: us abdomen limited ruq · 14 of 45 slices shown]
[im 1/45]
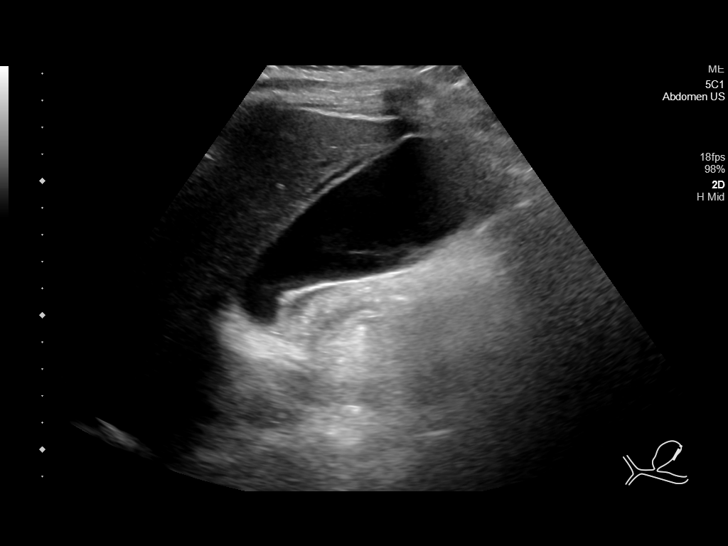
[im 4/45]
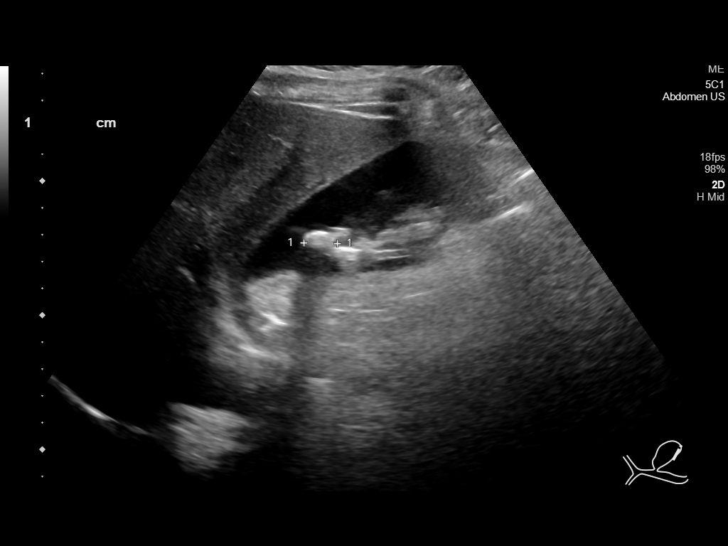
[im 8/45]
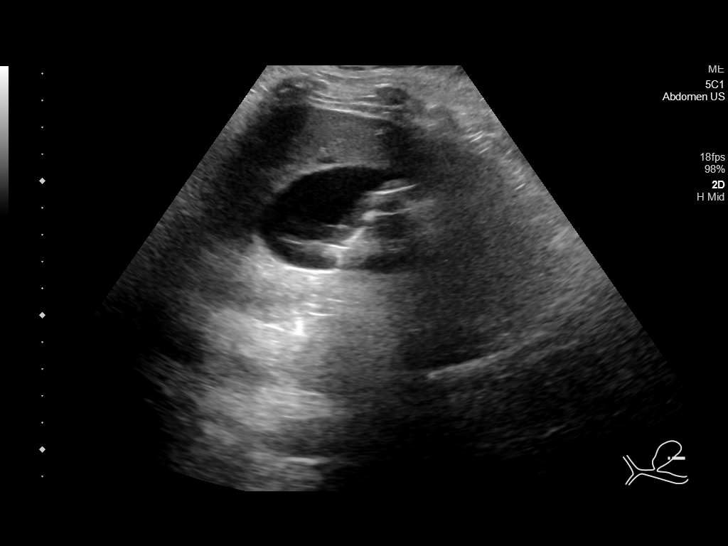
[im 12/45]
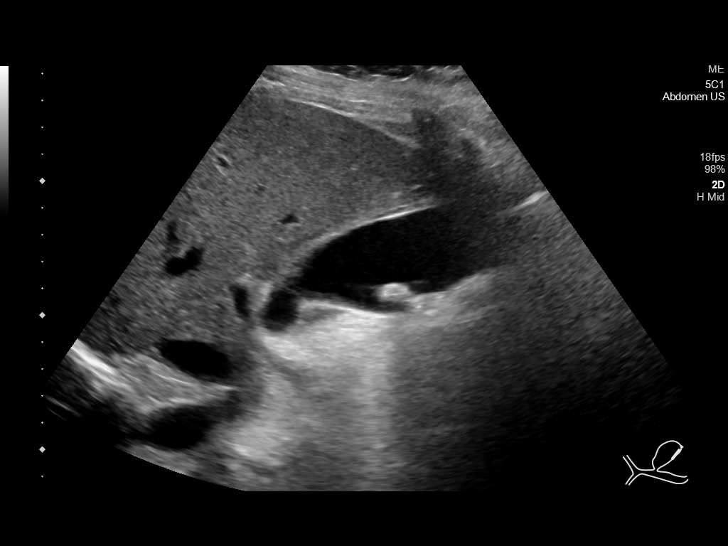
[im 15/45]
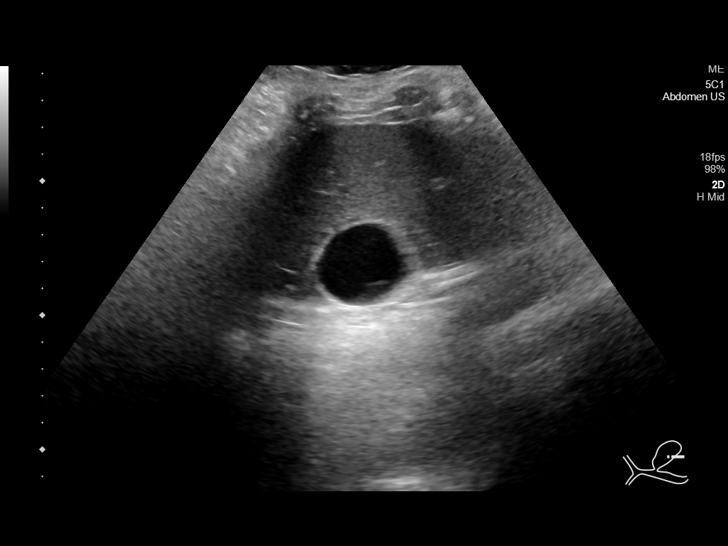
[im 17/45]
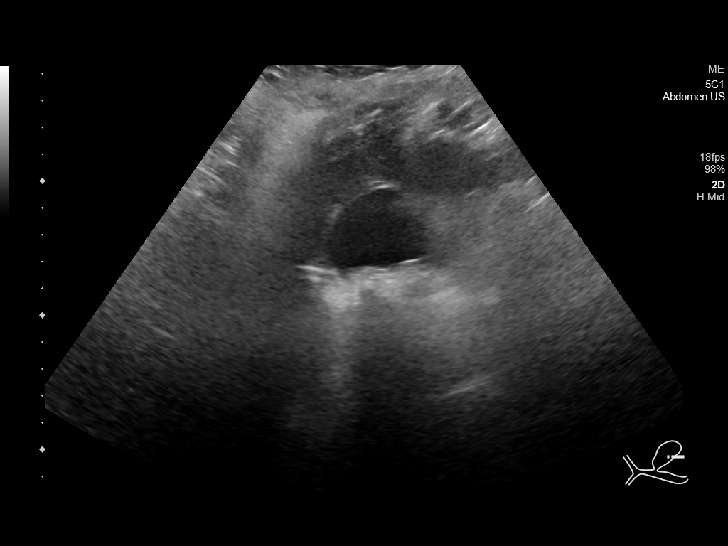
[im 21/45]
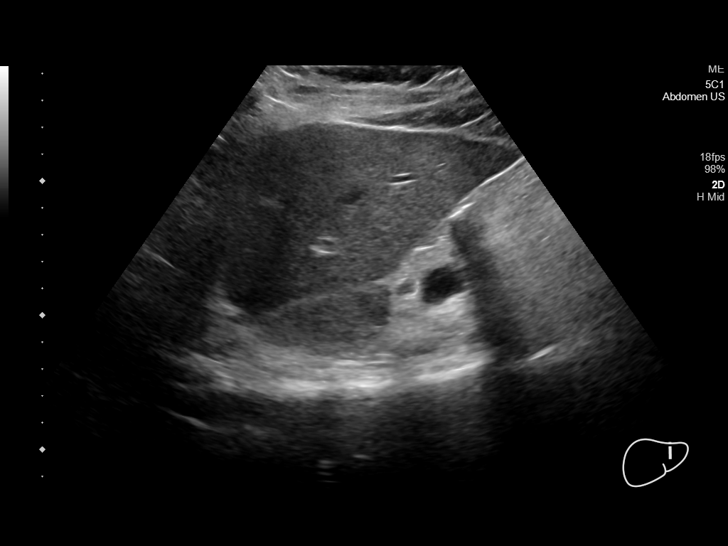
[im 24/45]
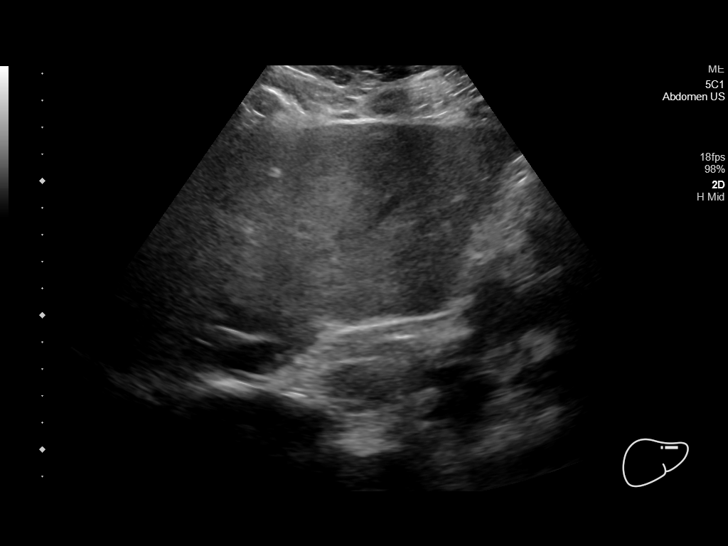
[im 28/45]
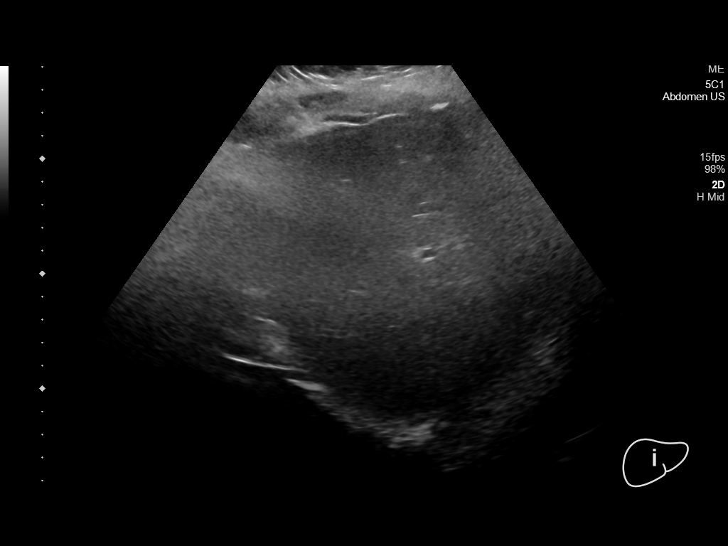
[im 30/45]
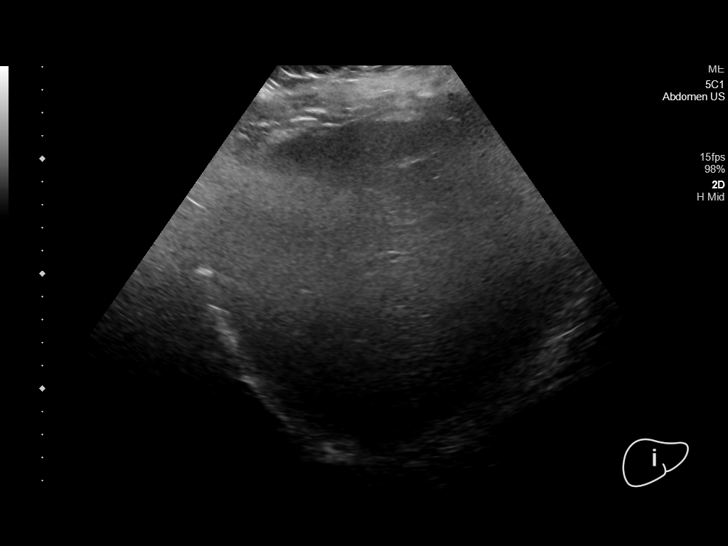
[im 34/45]
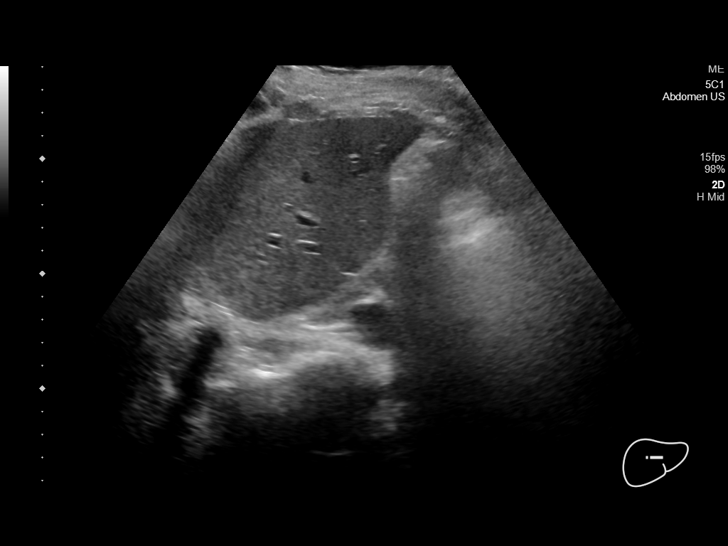
[im 37/45]
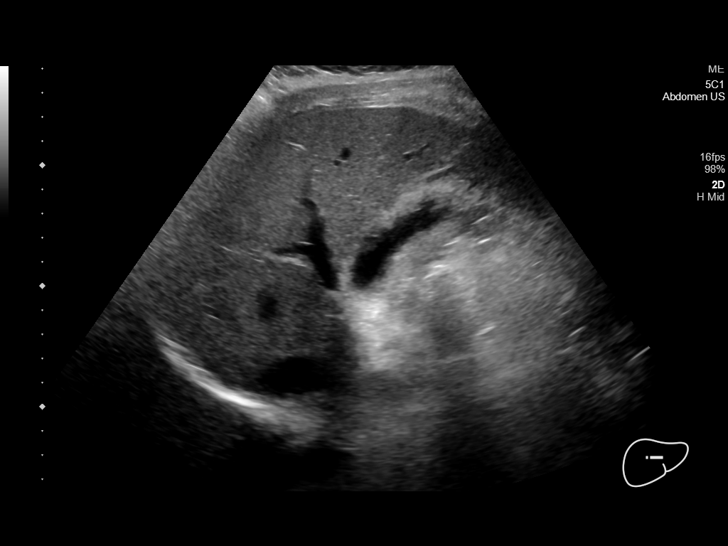
[im 41/45]
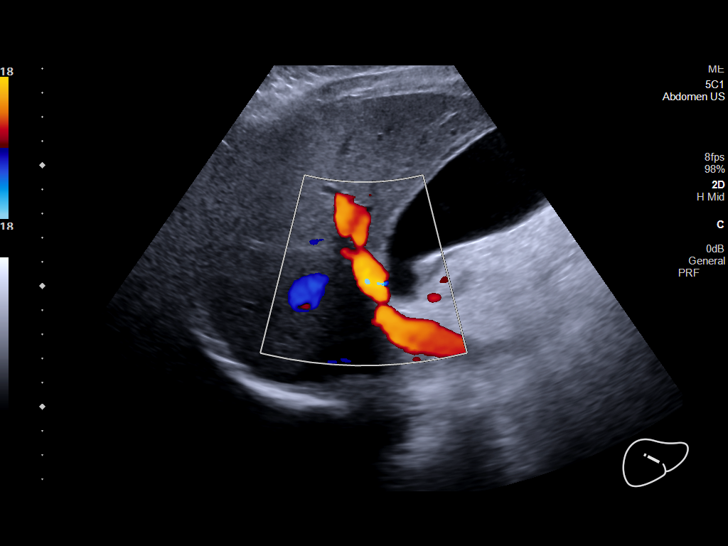
[im 45/45]
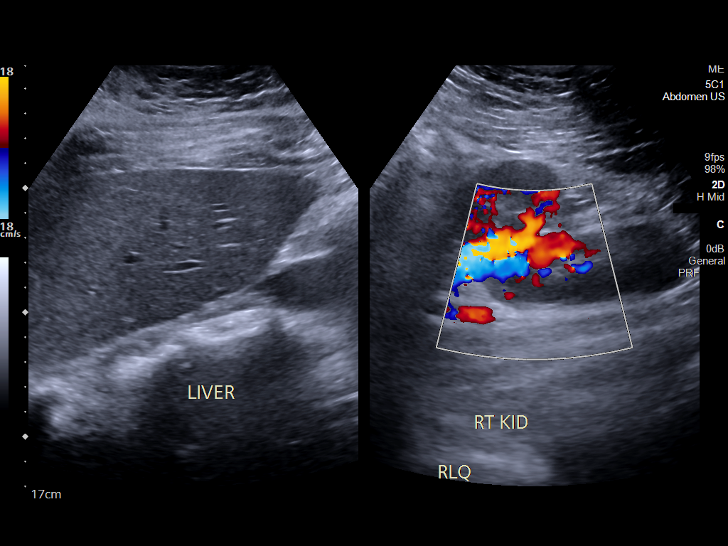

[14 of 25 positions shown; findings below may reference images not displayed]

FINDINGS: Gallbladder:

Multiple gallstones are noted with gallbladder wall thickening and
pericholecystic free fluid. The sonographic Murphy sign is negative.
The gallbladder wall measures up to approximately 6 mm in thickness.

Common bile duct:

Diameter: 5 mm

Liver:

No focal lesion identified. Within normal limits in parenchymal
echogenicity. Portal vein is patent on color Doppler imaging with
normal direction of blood flow towards the liver.

Other: None.
IMPRESSION: Findings are equivocal for acute cholecystitis. While there are
gallstones with gallbladder wall thickening, the sonographic Murphy
sign is reported as negative. Consider further evaluation with HIDA
scan as clinically indicated.

## 2020-11-19 IMAGING — CT CT ABD-PELV W/ CM
2 of 4 series · 15 of 46 positions shown, 17 images · IV contrast (APPLIED)
Comparison: None.

CLINICAL DATA: Nausea and vomiting with left lower quadrant pain

EXAM:
CT ABDOMEN AND PELVIS WITH CONTRAST
TECHNIQUE: Multidetector CT imaging of the abdomen and pelvis was performed
using the standard protocol following bolus administration of
intravenous contrast.
CONTRAST:  100mL OMNIPAQUE IOHEXOL 300 MG/ML  SOLN

[Series 2: routine abd/pel with · axial · 0.84mm/px · z∈[-433,+2]mm · 12 of 99 slices shown, 14 images]
[im 8/99  soft-tissue]
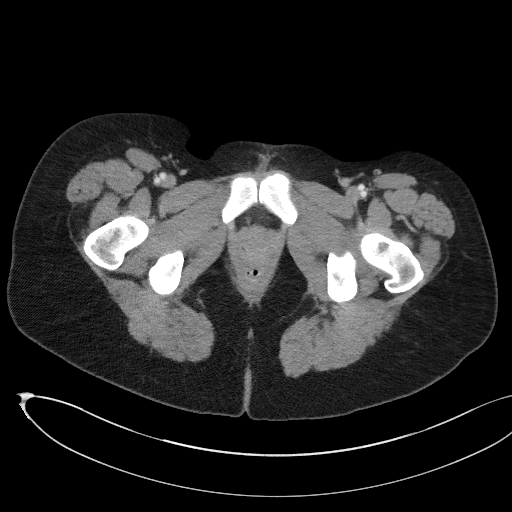
[im 8/99  bone]
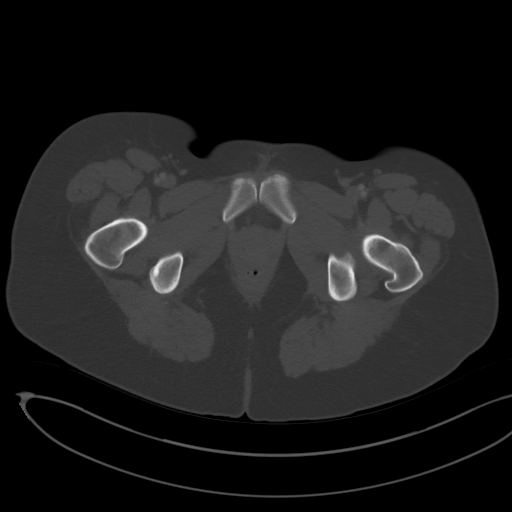
[im 16/99  soft-tissue]
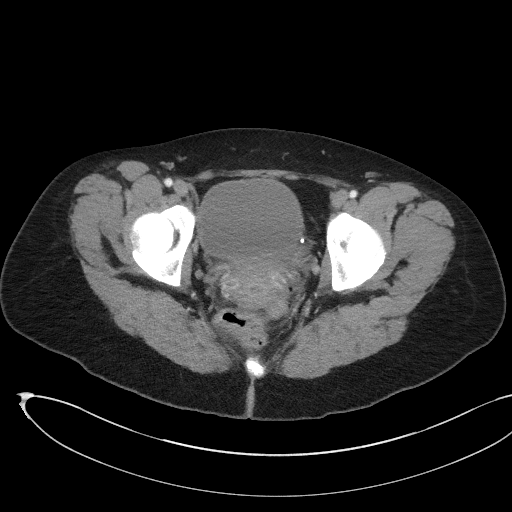
[im 24/99  soft-tissue]
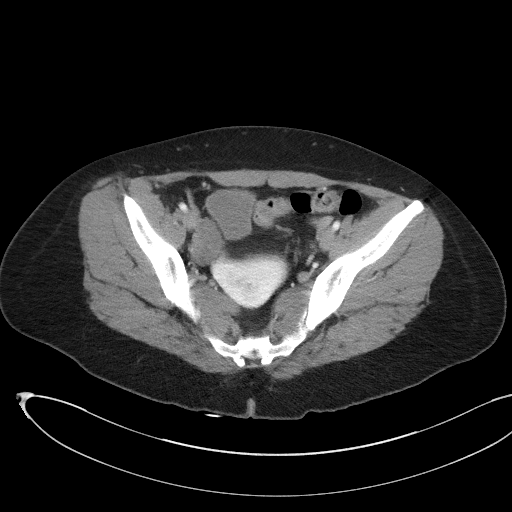
[im 32/99  soft-tissue]
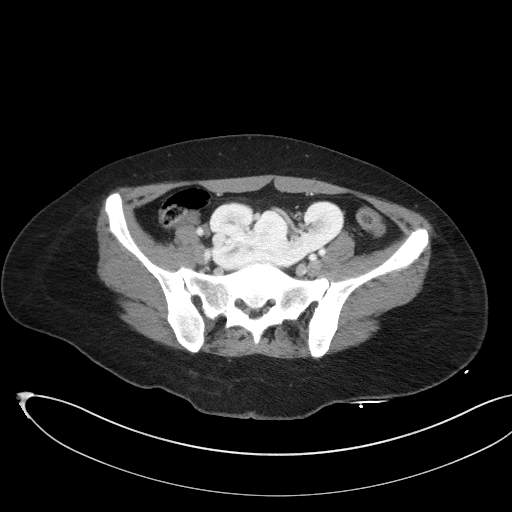
[im 40/99  soft-tissue]
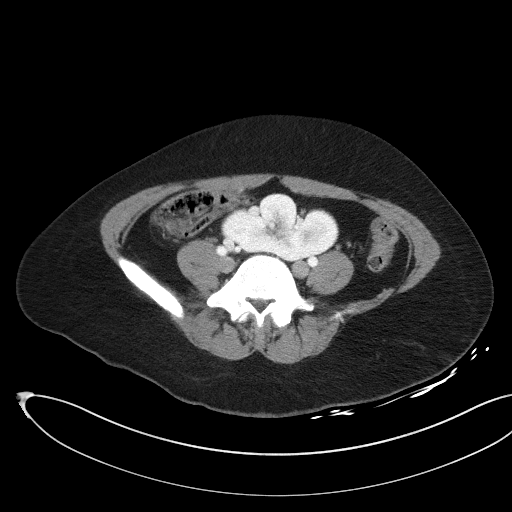
[im 48/99  soft-tissue]
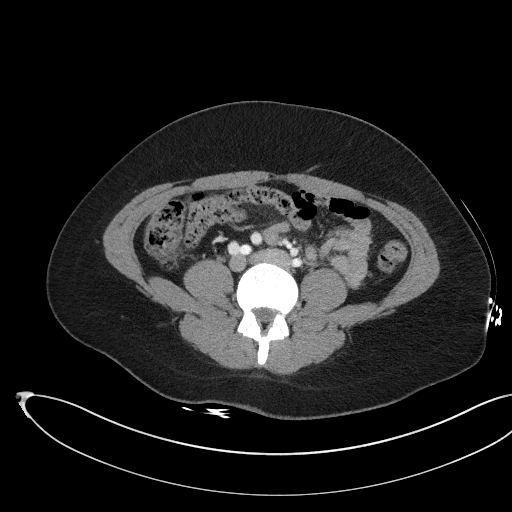
[im 55/99  soft-tissue]
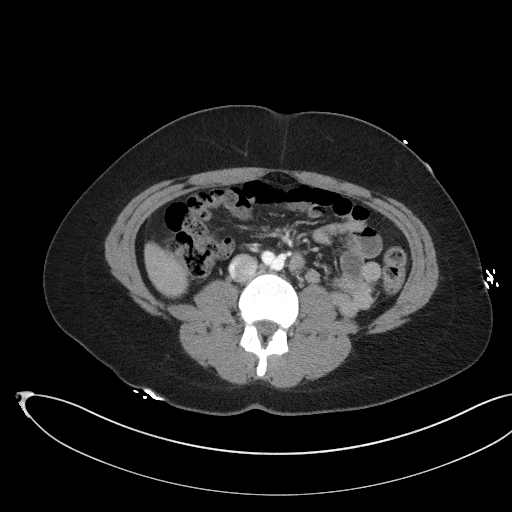
[im 63/99  soft-tissue]
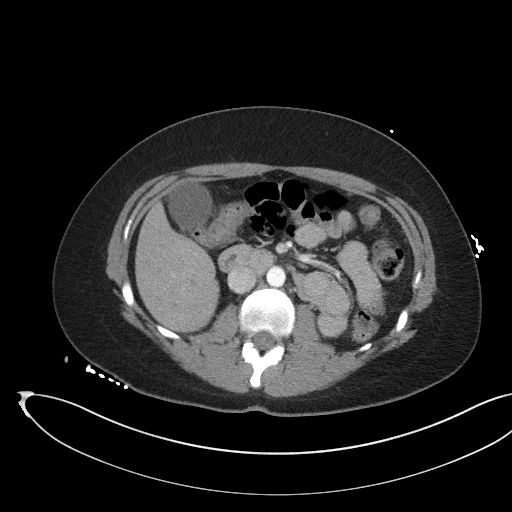
[im 71/99  soft-tissue]
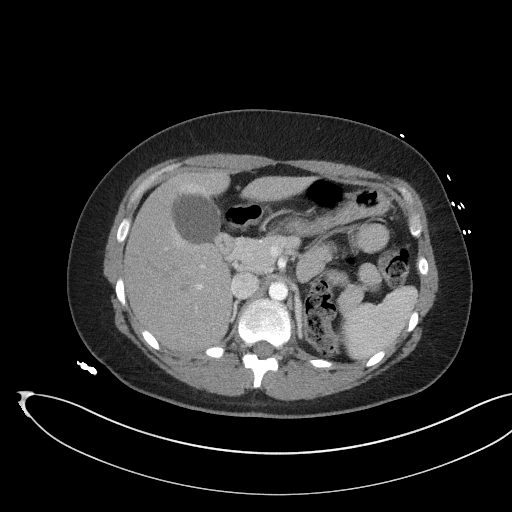
[im 71/99  bone]
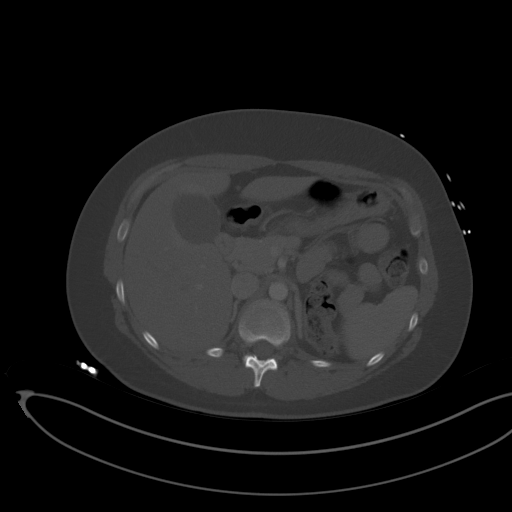
[im 79/99  soft-tissue]
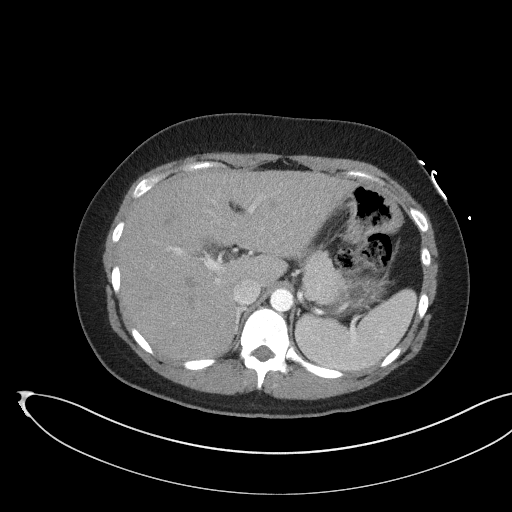
[im 87/99  soft-tissue]
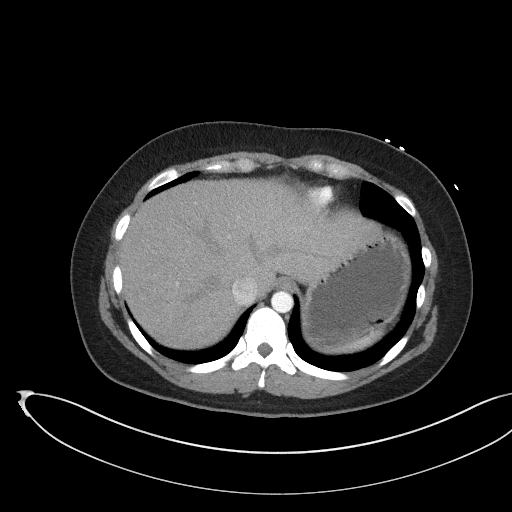
[im 95/99  soft-tissue]
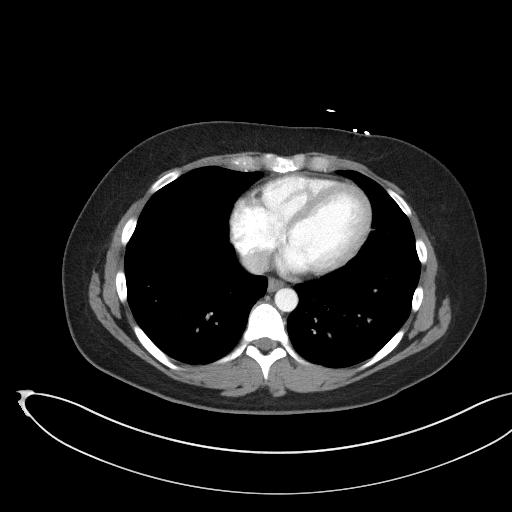

[Series 5: coronal st · coronal · 0.84mm/px · 3 of 90 slices shown]
[im 30/90  soft-tissue]
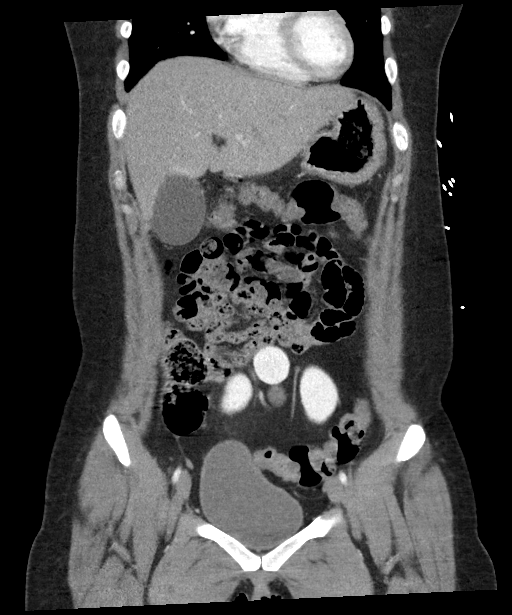
[im 40/90  soft-tissue]
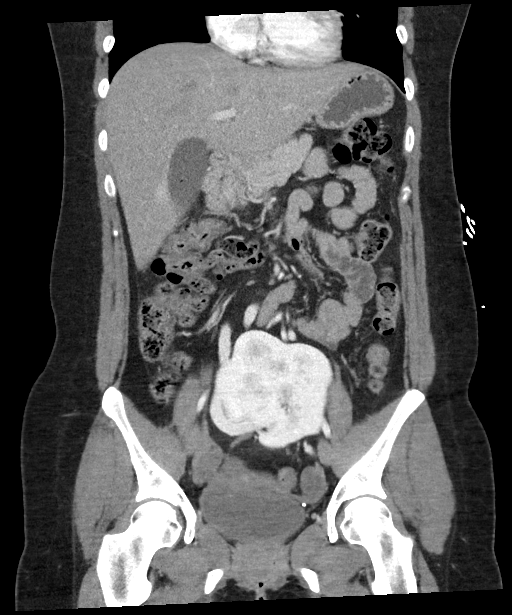
[im 50/90  soft-tissue]
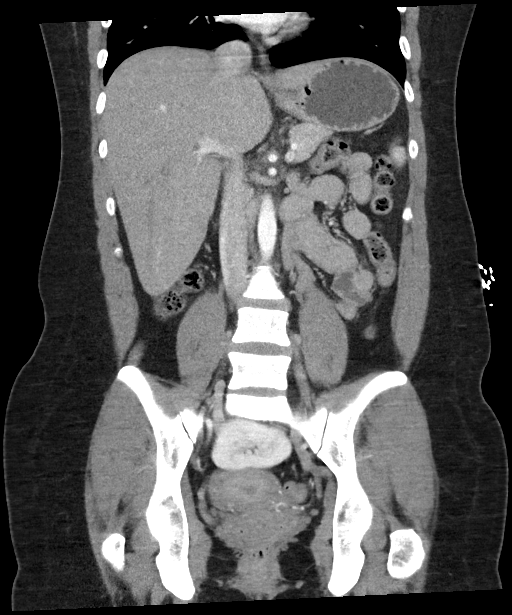

[15 of 46 positions shown; findings below may reference images not displayed]

FINDINGS: Lower chest: There is an ill-defined area of airspace opacity in the
anterior segment of the right lower lobe. There is also subtle
ill-defined opacity in portions of the lateral and posterior
segments of the right lower lobe. Lung bases otherwise are clear.

Hepatobiliary: There is mild fatty infiltration near the fissure for
the ligamentum teres. No other focal liver lesions are evident. Tiny
nitrogen containing gallstones noted. No gallbladder wall
thickening. No appreciable biliary duct dilatation.

Pancreas: No pancreatic mass or inflammatory focus.

Spleen: Spleen appears normal.

Adrenals/Urinary Tract: Renals appear normal. There is no evidence
of kidney in either flank region. In the pelvis, there is apparent
cross fused ectopia with kidney located anterior to the upper
sacrum. There is normal renal enhancement of this cross fused
ectopic kidney. No renal mass evident. There is mild hydronephrosis
in the left renal moiety. No evident but is no intrarenal calculus
evident. No ureteral calculus. A small calcification immediately
lateral to the urinary bladder is a likely phlebolith. It does not
appear to reside within ureter. Urinary bladder is midline with wall
thickness within normal limits.

Stomach/Bowel: No appreciable diverticular disease. No evident bowel
wall or mesenteric thickening. There is no evident bowel
obstruction. Terminal ileum appears normal. There is no free air or
portal venous air.

Vascular/Lymphatic: No abdominal aortic aneurysm. No arterial
vascular calcifications evident. Major venous structures appear
patent. There is no evident adenopathy in the abdomen or pelvis.

Reproductive: Uterus is anteverted.  No evident pelvic mass.

Other: Appendix appears normal. No abscess or ascites evident in the
abdomen or pelvis.

Musculoskeletal: No blastic or lytic bone lesions. No intramuscular
or abdominal wall lesions.
IMPRESSION: 1. Cross fused ectopic kidney in the mid pelvis at the mid sacral
level. There is mild hydronephrosis involving the left cross fused
ectopic moiety. No renal or ureteral calculus is appreciable.
Urinary bladder wall thickness is normal. Question recent calculus
passage on the left. Early pyelonephritis involving the left renal
moiety could present in this manner. No evidence suggesting renal
abscess. No perinephric fluid evident.

2. Areas of ill-defined airspace opacity in the right lower lobe.
Question atypical organism pneumonia. Check of [id number] status
advised in this regard.

3.  Tiny nitrogen containing gallstones noted.

4. No bowel obstruction. No abscess in the abdomen or pelvis.
Appendix appears normal.

## 2020-11-19 IMAGING — DX DG CHEST 1V PORT
1 series · 1 of 1 positions shown · non-contrast
Comparison: None.

CLINICAL DATA: Shortness of breath.  Nausea and vomiting

EXAM:
PORTABLE CHEST 1 VIEW

[chest ap]
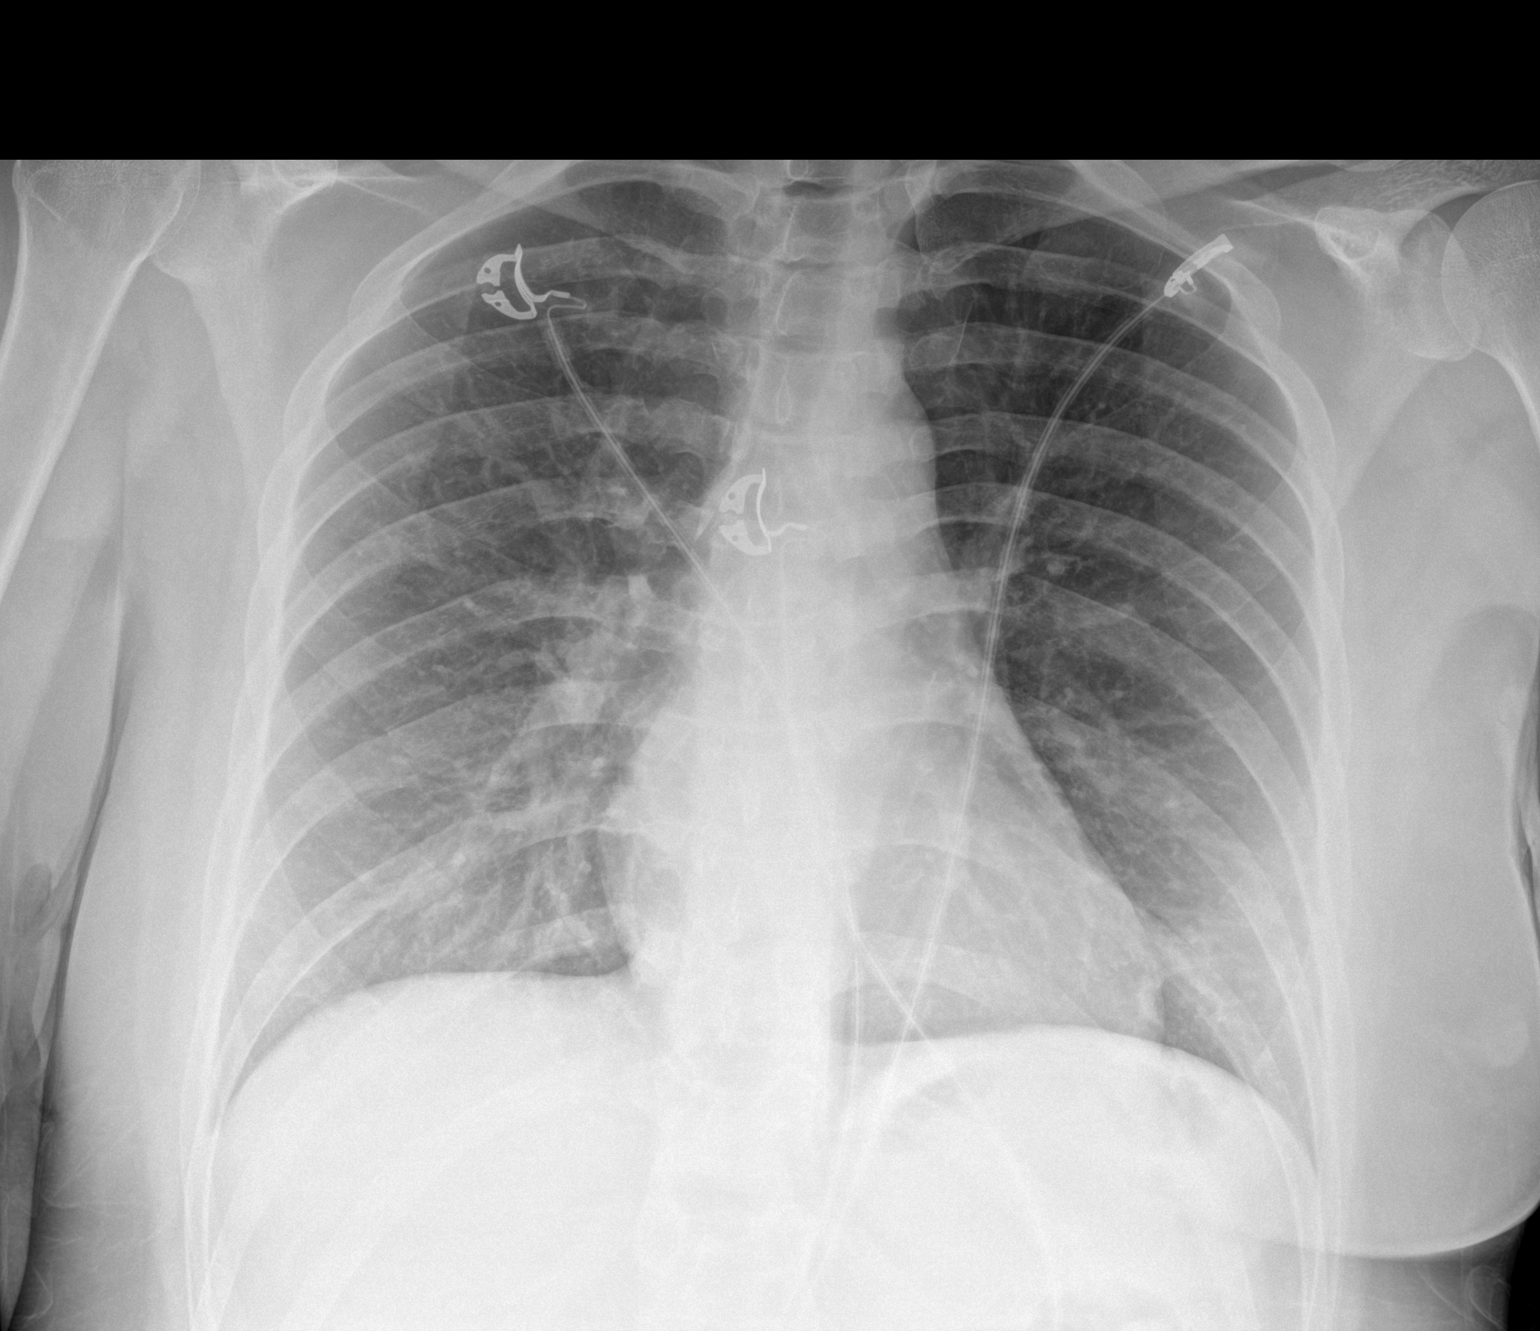

[1 of 1 positions shown; findings below may reference images not displayed]

FINDINGS: Lungs are clear. Heart size and pulmonary vascularity are normal. No
adenopathy. No bone lesions.
IMPRESSION: No abnormality noted.
